# Patient Record
Sex: Female | Born: 1950 | Race: White | Hispanic: No | Marital: Married | Smoking: Former smoker
Health system: Southern US, Community
[De-identification: ages and names within clinical notes are randomized; demographics above are authoritative.]

## PROBLEM LIST (undated history)

## (undated) DIAGNOSIS — Z79890 Hormone replacement therapy (postmenopausal): Secondary | ICD-10-CM

## (undated) DIAGNOSIS — K635 Polyp of colon: Secondary | ICD-10-CM

## (undated) DIAGNOSIS — T7840XA Allergy, unspecified, initial encounter: Secondary | ICD-10-CM

## (undated) DIAGNOSIS — Z973 Presence of spectacles and contact lenses: Secondary | ICD-10-CM

## (undated) DIAGNOSIS — Z524 Kidney donor: Secondary | ICD-10-CM

## (undated) DIAGNOSIS — R42 Dizziness and giddiness: Secondary | ICD-10-CM

## (undated) HISTORY — DX: Dizziness and giddiness: R42

## (undated) HISTORY — DX: Hormone replacement therapy: Z79.890

## (undated) HISTORY — PX: TUBAL LIGATION: SHX77

## (undated) HISTORY — DX: Allergy, unspecified, initial encounter: T78.40XA

## (undated) HISTORY — DX: Kidney donor: Z52.4

## (undated) HISTORY — DX: Polyp of colon: K63.5

## (undated) HISTORY — PX: WISDOM TOOTH EXTRACTION: SHX21

## (undated) HISTORY — DX: Presence of spectacles and contact lenses: Z97.3

---
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
## 2001-01-05 HISTORY — PX: BUNIONECTOMY: SHX129

## 2003-01-06 HISTORY — PX: OTHER SURGICAL HISTORY: SHX169

## 2009-11-05 ENCOUNTER — Ambulatory Visit: Payer: Self-pay | Admitting: Family Medicine

## 2010-01-05 HISTORY — PX: COLONOSCOPY: SHX174

## 2010-07-15 ENCOUNTER — Encounter: Payer: Self-pay | Admitting: Medical

## 2010-07-15 ENCOUNTER — Ambulatory Visit (INDEPENDENT_AMBULATORY_CARE_PROVIDER_SITE_OTHER): Payer: BC Managed Care – PPO | Admitting: Medical

## 2010-07-15 ENCOUNTER — Encounter: Payer: Self-pay | Admitting: Family Medicine

## 2010-07-15 VITALS — BP 118/72 | HR 60 | Temp 98.1°F | Ht 65.5 in | Wt 146.0 lb

## 2010-07-15 DIAGNOSIS — S90859A Superficial foreign body, unspecified foot, initial encounter: Secondary | ICD-10-CM

## 2010-07-15 DIAGNOSIS — H68009 Unspecified Eustachian salpingitis, unspecified ear: Secondary | ICD-10-CM

## 2010-07-15 DIAGNOSIS — IMO0002 Reserved for concepts with insufficient information to code with codable children: Secondary | ICD-10-CM

## 2010-07-15 MED ORDER — AMOXICILLIN 875 MG PO TABS: 875.0000 mg | ORAL_TABLET | Freq: Two times a day (BID) | ORAL | Status: AC

## 2010-07-15 NOTE — Progress Notes (Signed)
Subjective:   HPI  Grace Gates is a 60 y.o. female who presents for 2.5 wk hx/o cold symptoms, cough, congestion, but over the last week work ear pressure, neck and throat discomfort.  Last day or so with eye itching and clear drainage.  Using Nyquil and Dayquil which helps, but when she stops this the symptoms worsen.  She also notes splinter in her right foot since being at the beach last week.  No other aggravating or relieving factors.  No other c/o.  The following portions of the patient's history were reviewed and updated as appropriate: allergies, current medications, past family history, past medical history, past social history, past surgical history and problem list.   Review of Systems Constitutional: denies fever, chills, sweats ENT: +runny nose, ear pain; denies hoarseness, sinus pain Cardiology: denies chest pain, palpitations, edema Respiratory: denies cough, shortness of breath, wheezing Gastroenterology: denies abdominal pain, nausea, vomiting, diarrhea    Objective:   Physical Exam  General appearance: alert, no distress, WD/WN HEENT: normocephalic, sclerae anicteric, PERRLA, EOMi,TMs with serous effusion, nares with swollen turbinates and clear discharge, mild erythema of pharynx Oral cavity: MMM, no lesions Neck: supple, no lymphadenopathy, no thyromegaly, no masses Heart: RRR, normal S1, S2, no murmurs Lungs: CTA bilaterally, no wheezes, rhonchi, or rales Skin: right foot volar surface of proximal heel with indurated slightly raised area with obvious foreign body  Assessment :    Encounter Diagnoses  Name Primary?  . Eustachian tube salpingitis Yes  . Foreign body in foot      Plan:   Eustachian tube salpingitis - discussed symptomatic therapy, Amoxicillin BID, rest, call if not improving.   Foreign body - discussed risks/benefits of procedure, she consent.  Prepped in normal sterile fashion, used  1 cc lidocaine 1% without epi for local anesthesia, used  scalpel and forceps to explore and remove small 1mm pointed foreign body.  Covered with sterile bandage and some antibiotic ointment.  Pt tolerated procedure well.  Discussed wound care.  Return if any concerns.

## 2010-08-14 ENCOUNTER — Telehealth: Payer: Self-pay | Admitting: Family Medicine

## 2010-08-14 NOTE — Telephone Encounter (Signed)
Needs an appointment.

## 2010-08-14 NOTE — Telephone Encounter (Signed)
Pt had ov 1 mo ago  Ear pain.  It has started back.  Does she need to be seen again or can she have round 2 of antibiotics to [facility]  On [address]  Please advise pt.

## 2010-08-14 NOTE — Telephone Encounter (Signed)
Pt has made appt to see shane

## 2010-08-15 ENCOUNTER — Ambulatory Visit (INDEPENDENT_AMBULATORY_CARE_PROVIDER_SITE_OTHER): Payer: BC Managed Care – PPO | Admitting: Medical

## 2010-08-15 ENCOUNTER — Encounter: Payer: Self-pay | Admitting: Medical

## 2010-08-15 DIAGNOSIS — H68009 Unspecified Eustachian salpingitis, unspecified ear: Secondary | ICD-10-CM

## 2010-08-15 MED ORDER — AMOXICILLIN 875 MG PO TABS: 875.0000 mg | ORAL_TABLET | Freq: Two times a day (BID) | ORAL | Status: AC

## 2010-08-15 MED ORDER — FLUTICASONE PROPIONATE 50 MCG/ACT NA SUSP: 1.0000 | Freq: Every day | NASAL | Status: DC

## 2010-08-15 NOTE — Progress Notes (Signed)
  Subjective:   HPI Grace Gates is a 60 y.o. female who presents for recheck.  I saw her a month ago for eustachian tube dysfunction.  She took the round of Amoxicillin and felt much improved within a few days.  She has felt fine up until this past Sunday 6 days ago.  She notes left side ear pain, and a feeling of warmth/wet in the ear.  She denies swimming, denies ear discharge.  Using some OTC Dayquil with relief.  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  No other aggravating or relieving factors.  No other c/o.  The following portions of the patient's history were reviewed and updated as appropriate: allergies, current medications, past family history, past medical history, past social history, past surgical history and problem list.  Review of Systems GEN: no fever, chills, sweats Allergy: no sneezing, eye discharge or itching Skin: no rash HEENT: no ST, headache, sinus pressure, runny nose, no teeth pain Lung: no cough, SOB Neuro: no vision or hearing changes GI: no n/v/d, or abdominal pain    Objective:   Physical Exam  General appearance: alert, no distress, WD/WN, white female Skin: warm, dry, no warmth or erythema of left face HEENT: conjunctiva normal, nares patent, no swelling, pharynx normal, left TM with slight effusion behind TM, right TM normal Neck: supple, non tender, no lymphadenopathy, no thyromegaly Lungs: CTA    Assessment :    Encounter Diagnosis  Name Primary?  . Eustachian salpingitis Yes     Plan:     Advised Zyrtec OTC. If not improving in the next several days, add Fluticasone nasal.  If worsening ear pain, fever, then begin Amoxicillin.  I suspect this is allergen induced vs actual infection.  We discussed possibility of new adult onset allergen causing thinks problem.

## 2011-03-10 ENCOUNTER — Encounter: Payer: Self-pay | Admitting: Medical

## 2011-03-10 ENCOUNTER — Ambulatory Visit (INDEPENDENT_AMBULATORY_CARE_PROVIDER_SITE_OTHER): Payer: BC Managed Care – PPO | Admitting: Medical

## 2011-03-10 ENCOUNTER — Ambulatory Visit
Admission: RE | Admit: 2011-03-10 | Discharge: 2011-03-10 | Disposition: A | Discharge status: Home / Self Care | Payer: BC Managed Care – PPO | Source: Ambulatory Visit | Attending: Medical | Admitting: Medical

## 2011-03-10 VITALS — BP 120/80 | HR 62 | Temp 98.0°F | Resp 16 | Ht 65.0 in | Wt 152.0 lb

## 2011-03-10 DIAGNOSIS — Z0000 Encounter for general adult medical examination without abnormal findings: Secondary | ICD-10-CM

## 2011-03-10 DIAGNOSIS — M6740 Ganglion, unspecified site: Secondary | ICD-10-CM

## 2011-03-10 DIAGNOSIS — Z524 Kidney donor: Secondary | ICD-10-CM | POA: Insufficient documentation

## 2011-03-10 DIAGNOSIS — Z87891 Personal history of nicotine dependence: Secondary | ICD-10-CM

## 2011-03-10 DIAGNOSIS — R0789 Other chest pain: Secondary | ICD-10-CM

## 2011-03-10 DIAGNOSIS — R071 Chest pain on breathing: Secondary | ICD-10-CM

## 2011-03-10 LAB — LIPID PANEL
HDL: 63 mg/dL (ref >39)
LDL Cholesterol: 109 mg/dL — ABNORMAL HIGH (ref 0–99)
Total CHOL/HDL Ratio: 3 Ratio
Triglycerides: 87 mg/dL (ref <150)

## 2011-03-10 LAB — POCT URINALYSIS DIPSTICK
Bilirubin, UA: NEGATIVE
Ketones, UA: NEGATIVE
Leukocytes, UA: NEGATIVE
Protein, UA: NEGATIVE

## 2011-03-10 LAB — COMPREHENSIVE METABOLIC PANEL
ALT: 13 U/L (ref 0–35)
BUN: 16 mg/dL (ref 6–23)
CO2: 25 mEq/L (ref 19–32)
Calcium: 9.9 mg/dL (ref 8.4–10.5)
Creat: 1.03 mg/dL (ref 0.50–1.10)
Total Bilirubin: 0.5 mg/dL (ref 0.3–1.2)

## 2011-03-10 LAB — CBC WITH DIFFERENTIAL/PLATELET
Eosinophils Relative: 5 % (ref 0–5)
HCT: 41.7 % (ref 36.0–46.0)
Hemoglobin: 13.2 g/dL (ref 12.0–15.0)
Lymphocytes Relative: 26 % (ref 12–46)
Lymphs Abs: 1.1 10*3/uL (ref 0.7–4.0)
MCV: 91.2 fL (ref 78.0–100.0)
Monocytes Absolute: 0.6 10*3/uL (ref 0.1–1.0)
Monocytes Relative: 14 % — ABNORMAL HIGH (ref 3–12)
Neutro Abs: 2.4 10*3/uL (ref 1.7–7.7)
RBC: 4.57 MIL/uL (ref 3.87–5.11)
RDW: 14.2 % (ref 11.5–15.5)
WBC: 4.3 10*3/uL (ref 4.0–10.5)

## 2011-03-10 NOTE — Progress Notes (Signed)
Subjective:   HPI  Grace Gates is a 61 y.o. female who presents for a complete physical.  Sees gynecology routinely.  Last Tdap 2010, gets flu shot yearly, last dental care 10/12, last eye care 10/12, EKG with last physical, Dr. [facility].  Has had spirometry in the past in [place], hx/o former smoker long term.  Only c/o today is dullness across middle upper pack, mild intermittent pain.  Wants CXR.  She notes right wrist ganglion, no recent changes or enlargement.    Reviewed their medical, surgical, family, social, medication, and allergy history and updated chart as appropriate.  Past Medical History  Diagnosis Date  . Wears glasses     wears bifocals  . Allergy     seasonal  . Kidney donor   . Colon polyps   . Postmenopausal HRT (hormone replacement therapy)     [facility]  . H/O screening mammography     last 10/2010 along with pap smear    Past Surgical History  Procedure Date  . Tubal ligation   . Kidney transplant donor 2005  . Wisdom tooth extraction   . Appendectomy 1973  . Cholecystectomy 1973  . Colonoscopy 01/2010    [facility]    Family History  Problem Relation Age of Onset  . Alcohol abuse Mother   . Cirrhosis Mother     died of cirrhosis  . Cancer Father     died of rare bone cancer  . Heart disease Paternal Uncle     died of MI  . Stroke Neg Hx   . Hypertension Neg Hx   . Diabetes Neg Hx     History   Social History  . Marital Status: Married    Spouse Name: N/A    Number of Children: N/A  . Years of Education: N/A   Occupational History  . [employer]   Social History Main Topics  . Smoking status: Former Smoker -- 1.0 packs/day for 40 years  . Smokeless tobacco: Never Used  . Alcohol Use: 2.0 oz/week    4 drink(s) per week  . Drug Use: No  . Sexually Active: Not on file   Other Topics Concern  . Not on file   Social History  Narrative   Married, 2 boys, exercise 5 days/wk, walking    Current Outpatient Prescriptions on File Prior to Visit  Medication Sig Dispense Refill  . Estradiol 0.52 MG/0.87 GM (0.06%) GEL Place 1 Squirt onto the skin daily.        . fluticasone (FLONASE) 50 MCG/ACT nasal spray Place 1 spray into the nose daily.  16 g  2  . progesterone (PROMETRIUM) 100 MG capsule Take 100 mg by mouth daily.          Allergies  Allergen Reactions  . Latex     rash   Review of Systems Constitutional: -fever, -chills, -sweats, -unexpected weight change, -anorexia, -fatigue Allergy: -sneezing, -itching, -congestion Dermatology: denies changing moles, rash, lumps, new worrisome lesions ENT: -runny nose, -ear pain, -sore throat, -hoarseness, -sinus pain, -teeth pain, -tinnitus, -hearing loss, -epistaxis Cardiology:  -chest pain, -palpitations, -edema, -orthopnea, -paroxysmal nocturnal dyspnea Respiratory: -cough, -shortness of breath, -dyspnea on exertion, -wheezing, -hemoptysis Gastroenterology: -abdominal pain, -nausea, -vomiting, -diarrhea, -constipation, -blood in stool, -changes in bowel movement, -dysphagia Hematology: -bleeding or bruising problems Musculoskeletal: -arthralgias, -myalgias, -joint swelling, +back pain, -neck pain, -cramping, -gait changes Ophthalmology: -vision changes, -eye redness, -itching, -discharge Urology: -  dysuria, -difficulty urinating, -hematuria, -urinary frequency, -urgency, incontinence Neurology: -headache, -weakness, -tingling, -numbness, -speech abnormality, -memory loss, -falls, -dizziness Psychology:  -depressed mood, -agitation, -sleep problems    Objective:   Physical Exam  Filed Vitals:   03/10/11 0841  BP: 120/80  Pulse: 62  Temp: 98 F (36.7 C)    General appearance: alert, no distress, WD/WN, lean white female Skin: left lower jaw with 3mm raised round, somewhat uniform flesh colored lesion.  Similar smaller 2.5 mm lesion of right face, slightly  furrowed, burt uniform flesh colored, otherwise skin unremarkable HEENT: normocephalic, conjunctiva/corneas normal, sclerae anicteric, PERRLA, EOMi, nares patent, no discharge or erythema, pharynx normal Oral cavity: MMM, tongue normal, teeth in good repair Neck: supple, no lymphadenopathy, no thyromegaly, no masses, normal ROM, no bruits Chest: non tender, normal shape and expansion Heart: RRR, normal S1, S2, no murmurs Lungs: CTA bilaterally, no wheezes, rhonchi, or rales Abdomen: +bs, right vertical and central vertical surgical scars, otherwise soft, non tender, non distended, no masses, no hepatomegaly, no splenomegaly, no bruits Back: non tender, normal ROM, no scoliosis Musculoskeletal: right dorsal hand along 4th metacarpal with roundish 1cm nodule that moves along tendon sheath, c/w ganglion cyst, upper extremities non tender, no obvious deformity, normal ROM throughout, lower extremities non tender, no obvious deformity, normal ROM throughout Extremities: no edema, no cyanosis, no clubbing Pulses: 2+ symmetric, upper and lower extremities, normal cap refill Neurological: alert, oriented x 3, CN2-12 intact, strength normal upper extremities and lower extremities, sensation normal throughout, DTRs 2+ throughout, no cerebellar signs, gait normal Psychiatric: normal affect, behavior normal, pleasant  Breast/gyn/rectal - deferred to gynecology   Assessment and Plan :    Encounter Diagnoses  Name Primary?  . Routine general medical examination at a health care facility Yes  . Chest wall pain   . Former smoker   . Ganglion cyst   . Kidney donor    Physical exam - discussed healthy lifestyle, diet, exercise, preventative care, vaccinations, and addressed their concerns.  Handout given.  It would be helpful to get copies of colonoscopy, prior EKG, prior spirometry.  Chest wall pain, posterior - CXR today.  Likely muscle spasm, but given smoking hx/o, will pursue CXR.  Ganglion cyst -  discussed findings.  When this worsens over time, she will let me know when she wants referral to ortho.   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  Follow-up pending labs.

## 2011-03-10 NOTE — Patient Instructions (Signed)

## 2011-07-16 ENCOUNTER — Encounter: Payer: Self-pay | Admitting: Family Medicine

## 2011-07-16 ENCOUNTER — Ambulatory Visit (INDEPENDENT_AMBULATORY_CARE_PROVIDER_SITE_OTHER): Payer: BC Managed Care – PPO | Admitting: Family Medicine

## 2011-07-16 VITALS — BP 112/70 | HR 74 | Wt 148.0 lb

## 2011-07-16 DIAGNOSIS — M6740 Ganglion, unspecified site: Secondary | ICD-10-CM

## 2011-07-16 DIAGNOSIS — M25542 Pain in joints of left hand: Secondary | ICD-10-CM

## 2011-07-16 DIAGNOSIS — M25549 Pain in joints of unspecified hand: Secondary | ICD-10-CM

## 2011-07-16 DIAGNOSIS — M67441 Ganglion, right hand: Secondary | ICD-10-CM

## 2011-07-16 NOTE — Progress Notes (Signed)
  Subjective:    Patient ID: Grace Gates, female    DOB: [birth year], 61 y.o.   MRN: [id number]  HPI She complains of pain and interference with function of the fourth finger at the PIP joint. She has no history of injury to this. She also has a ganglion present on the right hand that is apparently getting slightly larger.   Review of Systems     Objective:   Physical Exam 1-1/2 cm movable fluctuant lesion noted on the dorsal surface of the right hand that moves with motion of the finger. Exam of the left hand shows slight tenderness palpation at the joint but no effusion noted. No other joints seem to be involved.       Assessment & Plan:   1. Ganglion of right hand  Ambulatory referral to Orthopedic Surgery  2. Pain in joint of left hand  Ambulatory referral to Orthopedic Surgery   the etiology of her left hand pain is unclear. Orthopedic referral was made

## 2011-09-15 ENCOUNTER — Other Ambulatory Visit: Payer: Self-pay | Admitting: Orthopedic Surgery

## 2011-09-21 ENCOUNTER — Encounter (HOSPITAL_BASED_OUTPATIENT_CLINIC_OR_DEPARTMENT_OTHER)
Admission: RE | Admit: 2011-09-21 | Discharge: 2011-09-21 | Disposition: A | Discharge status: Home / Self Care | Payer: BC Managed Care – PPO | Source: Ambulatory Visit | Attending: Orthopedic Surgery | Admitting: Orthopedic Surgery

## 2011-09-22 ENCOUNTER — Encounter (HOSPITAL_BASED_OUTPATIENT_CLINIC_OR_DEPARTMENT_OTHER): Payer: Self-pay | Admitting: *Deleted

## 2011-09-22 NOTE — Progress Notes (Signed)
No labs needed

## 2011-09-25 ENCOUNTER — Encounter (HOSPITAL_BASED_OUTPATIENT_CLINIC_OR_DEPARTMENT_OTHER): Payer: Self-pay | Admitting: Anesthesiology

## 2011-09-25 ENCOUNTER — Encounter (HOSPITAL_BASED_OUTPATIENT_CLINIC_OR_DEPARTMENT_OTHER)
Admission: RE | Disposition: A | Discharge status: Home / Self Care | Payer: Self-pay | Source: Ambulatory Visit | Attending: Orthopedic Surgery

## 2011-09-25 ENCOUNTER — Ambulatory Visit (HOSPITAL_BASED_OUTPATIENT_CLINIC_OR_DEPARTMENT_OTHER)
Admission: RE | Admit: 2011-09-25 | Discharge: 2011-09-25 | Disposition: A | Discharge status: Home / Self Care | Payer: BC Managed Care – PPO | Source: Ambulatory Visit | Attending: Orthopedic Surgery | Admitting: Orthopedic Surgery

## 2011-09-25 ENCOUNTER — Encounter (HOSPITAL_BASED_OUTPATIENT_CLINIC_OR_DEPARTMENT_OTHER): Payer: Self-pay | Admitting: Orthopedic Surgery

## 2011-09-25 ENCOUNTER — Ambulatory Visit (HOSPITAL_BASED_OUTPATIENT_CLINIC_OR_DEPARTMENT_OTHER): Payer: BC Managed Care – PPO | Admitting: Anesthesiology

## 2011-09-25 DIAGNOSIS — M6740 Ganglion, unspecified site: Secondary | ICD-10-CM | POA: Insufficient documentation

## 2011-09-25 HISTORY — PX: MASS EXCISION: SHX2000

## 2011-09-25 LAB — POCT HEMOGLOBIN-HEMACUE: Hemoglobin: 14.4 g/dL (ref 12.0–15.0)

## 2011-09-25 SURGERY — EXCISION MASS
Anesthesia: General | Site: Wrist | Laterality: Left | Wound class: Clean

## 2011-09-25 MED ORDER — PROMETHAZINE HCL 25 MG/ML IJ SOLN: 6.2500 mg | INTRAMUSCULAR | Status: DC | PRN

## 2011-09-25 MED ORDER — FENTANYL CITRATE 0.05 MG/ML IJ SOLN
INTRAMUSCULAR | Status: DC | PRN
  Administered 2011-09-25 (×2): 50 ug via INTRAVENOUS

## 2011-09-25 MED ORDER — OXYCODONE HCL 5 MG/5ML PO SOLN: 5.0000 mg | Freq: Once | ORAL | Status: DC | PRN

## 2011-09-25 MED ORDER — CHLORHEXIDINE GLUCONATE 4 % EX LIQD: 60.0000 mL | Freq: Once | CUTANEOUS | Status: DC

## 2011-09-25 MED ORDER — BUPIVACAINE HCL (PF) 0.25 % IJ SOLN
INTRAMUSCULAR | Status: DC | PRN
  Administered 2011-09-25: 2.5 mL

## 2011-09-25 MED ORDER — CEFAZOLIN SODIUM-DEXTROSE 2-3 GM-% IV SOLR
2.0000 g | INTRAVENOUS | Status: AC
  Administered 2011-09-25: 2 g via INTRAVENOUS

## 2011-09-25 MED ORDER — HYDROCODONE-ACETAMINOPHEN 5-500 MG PO TABS: 1.0000 | ORAL_TABLET | ORAL | Status: AC | PRN

## 2011-09-25 MED ORDER — LACTATED RINGERS IV SOLN
INTRAVENOUS | Status: DC
  Administered 2011-09-25 (×2): via INTRAVENOUS

## 2011-09-25 MED ORDER — ONDANSETRON HCL 4 MG/2ML IJ SOLN
INTRAMUSCULAR | Status: DC | PRN
  Administered 2011-09-25: 4 mg via INTRAVENOUS

## 2011-09-25 MED ORDER — DEXAMETHASONE SODIUM PHOSPHATE 10 MG/ML IJ SOLN
INTRAMUSCULAR | Status: DC | PRN
  Administered 2011-09-25: 10 mg via INTRAVENOUS

## 2011-09-25 MED ORDER — HYDROMORPHONE HCL PF 1 MG/ML IJ SOLN: 0.2500 mg | INTRAMUSCULAR | Status: DC | PRN

## 2011-09-25 MED ORDER — OXYCODONE HCL 5 MG PO TABS: 5.0000 mg | ORAL_TABLET | Freq: Once | ORAL | Status: DC | PRN

## 2011-09-25 MED ORDER — LIDOCAINE HCL (CARDIAC) 20 MG/ML IV SOLN
INTRAVENOUS | Status: DC | PRN
  Administered 2011-09-25: 50 mg via INTRAVENOUS

## 2011-09-25 MED ORDER — MEPERIDINE HCL 25 MG/ML IJ SOLN: 6.2500 mg | INTRAMUSCULAR | Status: DC | PRN

## 2011-09-25 SURGICAL SUPPLY — 50 items
BANDAGE COBAN STERILE 2 (GAUZE/BANDAGES/DRESSINGS) IMPLANT
BANDAGE GAUZE ELAST BULKY 4 IN (GAUZE/BANDAGES/DRESSINGS) ×2 IMPLANT
BLADE MINI RND TIP GREEN BEAV (BLADE) IMPLANT
BLADE SURG 15 STRL LF DISP TIS (BLADE) ×1 IMPLANT
BLADE SURG 15 STRL SS (BLADE) ×1
BNDG COHESIVE 1X5 TAN STRL LF (GAUZE/BANDAGES/DRESSINGS) IMPLANT
BNDG COHESIVE 3X5 TAN STRL LF (GAUZE/BANDAGES/DRESSINGS) IMPLANT
BNDG ESMARK 4X9 LF (GAUZE/BANDAGES/DRESSINGS) IMPLANT
CHLORAPREP W/TINT 26ML (MISCELLANEOUS) ×2 IMPLANT
CLOTH BEACON ORANGE TIMEOUT ST (SAFETY) ×2 IMPLANT
CORDS BIPOLAR (ELECTRODE) ×2 IMPLANT
COVER MAYO STAND STRL (DRAPES) ×2 IMPLANT
COVER TABLE BACK 60X90 (DRAPES) ×2 IMPLANT
CUFF TOURNIQUET SINGLE 18IN (TOURNIQUET CUFF) ×2 IMPLANT
DECANTER SPIKE VIAL GLASS SM (MISCELLANEOUS) IMPLANT
DRAIN PENROSE 1/2X12 LTX STRL (WOUND CARE) IMPLANT
DRAPE EXTREMITY T 121X128X90 (DRAPE) ×2 IMPLANT
DRAPE SURG 17X23 STRL (DRAPES) ×2 IMPLANT
GAUZE XEROFORM 1X8 LF (GAUZE/BANDAGES/DRESSINGS) ×2 IMPLANT
GLOVE BIO SURGEON STRL SZ 6.5 (GLOVE) IMPLANT
GLOVE SKINSENSE NS SZ6.5 (GLOVE) ×1
GLOVE SKINSENSE NS SZ8.0 LF (GLOVE) ×1
GLOVE SKINSENSE STRL SZ6.5 (GLOVE) ×1 IMPLANT
GLOVE SKINSENSE STRL SZ8.0 LF (GLOVE) ×1 IMPLANT
GLOVE SURG ORTHO 8.0 STRL STRW (GLOVE) IMPLANT
GOWN BRE IMP PREV XXLGXLNG (GOWN DISPOSABLE) ×2 IMPLANT
GOWN PREVENTION PLUS XLARGE (GOWN DISPOSABLE) ×2 IMPLANT
NDL SAFETY ECLIPSE 18X1.5 (NEEDLE) ×1 IMPLANT
NEEDLE 27GAX1X1/2 (NEEDLE) ×2 IMPLANT
NEEDLE HYPO 18GX1.5 SHARP (NEEDLE) ×1
NS IRRIG 1000ML POUR BTL (IV SOLUTION) ×2 IMPLANT
PACK BASIN DAY SURGERY FS (CUSTOM PROCEDURE TRAY) ×2 IMPLANT
PAD CAST 3X4 CTTN HI CHSV (CAST SUPPLIES) IMPLANT
PADDING CAST ABS 3INX4YD NS (CAST SUPPLIES)
PADDING CAST ABS 4INX4YD NS (CAST SUPPLIES)
PADDING CAST ABS COTTON 3X4 (CAST SUPPLIES) IMPLANT
PADDING CAST ABS COTTON 4X4 ST (CAST SUPPLIES) IMPLANT
PADDING CAST COTTON 3X4 STRL (CAST SUPPLIES)
SPLINT PLASTER CAST XFAST 3X15 (CAST SUPPLIES) IMPLANT
SPLINT PLASTER XTRA FASTSET 3X (CAST SUPPLIES)
SPONGE GAUZE 4X4 12PLY (GAUZE/BANDAGES/DRESSINGS) ×2 IMPLANT
STOCKINETTE 4X48 STRL (DRAPES) ×2 IMPLANT
SUT VIC AB 4-0 P2 18 (SUTURE) IMPLANT
SUT VICRYL RAPID 5 0 P 3 (SUTURE) IMPLANT
SUT VICRYL RAPIDE 4/0 PS 2 (SUTURE) ×2 IMPLANT
SYR BULB 3OZ (MISCELLANEOUS) ×2 IMPLANT
SYR CONTROL 10ML LL (SYRINGE) ×2 IMPLANT
TOWEL OR 17X24 6PK STRL BLUE (TOWEL DISPOSABLE) ×2 IMPLANT
UNDERPAD 30X30 INCONTINENT (UNDERPADS AND DIAPERS) ×2 IMPLANT
WATER STERILE IRR 1000ML POUR (IV SOLUTION) ×2 IMPLANT

## 2011-09-25 NOTE — Anesthesia Procedure Notes (Signed)
Procedure Name: LMA Insertion Date/Time: 09/25/2011 12:43 PM Performed by: Burna Cash Pre-anesthesia Checklist: Patient identified, Emergency Drugs available, Suction available and Patient being monitored Patient Re-evaluated:Patient Re-evaluated prior to inductionOxygen Delivery Method: Circle System Utilized Preoxygenation: Pre-oxygenation with 100% oxygen Intubation Type: IV induction Ventilation: Mask ventilation without difficulty LMA: LMA inserted LMA Size: 4.0 Number of attempts: 1 Airway Equipment and Method: bite block Placement Confirmation: positive ETCO2 Tube secured with: Tape Dental Injury: Teeth and Oropharynx as per pre-operative assessment

## 2011-09-25 NOTE — Op Note (Signed)
Dictated number: [id number]

## 2011-09-25 NOTE — Brief Op Note (Signed)
09/25/2011  1:17 PM  PATIENT:  Grace Gates  61 y.o. female  PRE-OPERATIVE DIAGNOSIS:  cyst right ring extensor tendon  POST-OPERATIVE DIAGNOSIS:  cyst right ring extensor tendon  PROCEDURE:  Procedure(s) (LRB) with comments: EXCISION MASS (Left) - excision cyst right ring finger and possible repair of extensor tendon wrist  SURGEON:  Surgeon(s) and Role:    * Nicki Reaper, MD - Primary  PHYSICIAN ASSISTANT:   ASSISTANTS: none   ANESTHESIA:   local and general  EBL:  Total I/O In: 1000 [I.V.:1000] Out: -   BLOOD ADMINISTERED:none  DRAINS: none   LOCAL MEDICATIONS USED:  MARCAINE     SPECIMEN:  Excision  DISPOSITION OF SPECIMEN:  PATHOLOGY  COUNTS:  YES  TOURNIQUET:   Total Tourniquet Time Documented: Upper Arm (Left) - 16 minutes  DICTATION: .Other Dictation: Dictation Number [id number]  PLAN OF CARE: Discharge to home after PACU  PATIENT DISPOSITION:  PACU - hemodynamically stable.

## 2011-09-25 NOTE — Transfer of Care (Signed)
Immediate Anesthesia Transfer of Care Note  Patient: Grace Gates  Procedure(s) Performed: Procedure(s) (LRB) with comments: EXCISION MASS (Left) - excision cyst right ring finger and possible repair of extensor tendon wrist  Patient Location: PACU  Anesthesia Type: General  Level of Consciousness: sedated  Airway & Oxygen Therapy: Patient Spontanous Breathing and Patient connected to face mask oxygen  Post-op Assessment: Report given to PACU RN and Post -op Vital signs reviewed and stable  Post vital signs: Reviewed and stable  Complications: No apparent anesthesia complications

## 2011-09-25 NOTE — Anesthesia Postprocedure Evaluation (Signed)
  Anesthesia Post-op Note  Patient: Grace Gates  Procedure(s) Performed: Procedure(s) (LRB) with comments: EXCISION MASS (Left) - excision cyst right ring finger and possible repair of extensor tendon wrist  Patient Location: PACU  Anesthesia Type: General  Level of Consciousness: awake  Airway and Oxygen Therapy: Patient Spontanous Breathing  Post-op Pain: mild  Post-op Assessment: Post-op Vital signs reviewed  Post-op Vital Signs: stable  Complications: No apparent anesthesia complications

## 2011-09-25 NOTE — Anesthesia Preprocedure Evaluation (Signed)
Anesthesia Evaluation  Patient identified by MRN, date of birth, ID band Patient awake    Reviewed: Allergy & Precautions, H&P , NPO status , Patient's Chart, lab work & pertinent test results  History of Anesthesia Complications Negative for: history of anesthetic complications  Airway Mallampati: I  Neck ROM: full    Dental No notable dental hx.    Pulmonary neg pulmonary ROS,  breath sounds clear to auscultation  Pulmonary exam normal       Cardiovascular negative cardio ROS  IRhythm:regular Rate:Normal     Neuro/Psych negative neurological ROS  negative psych ROS   GI/Hepatic negative GI ROS, Neg liver ROS,   Endo/Other  negative endocrine ROS  Renal/GU negative Renal ROS  negative genitourinary   Musculoskeletal   Abdominal   Peds  Hematology negative hematology ROS (+)   Anesthesia Other Findings   Reproductive/Obstetrics negative OB ROS                           Anesthesia Physical Anesthesia Plan  ASA: I  Anesthesia Plan: General and General LMA   Post-op Pain Management:    Induction:   Airway Management Planned:   Additional Equipment:   Intra-op Plan:   Post-operative Plan:   Informed Consent: I have reviewed the patients History and Physical, chart, labs and discussed the procedure including the risks, benefits and alternatives for the proposed anesthesia with the patient or authorized representative who has indicated his/her understanding and acceptance.     Plan Discussed with: CRNA and Surgeon  Anesthesia Plan Comments:         Anesthesia Quick Evaluation

## 2011-09-25 NOTE — H&P (Signed)
Grace Gates is a 61 year-old right-hand dominant female referred by Dr. Susann Givens for consultation with respect to pain in her left ring finger, mass on the dorsal aspect of her right hand.  This has been present on her right hand for approximately four months. She has had pain in her left ring finger for approximately five weeks, complains of this over the metacarpophalangeal joint volar aspect. She has no history of injury.  She has no history of diabetes, thyroid problems, arthritis or gout. There is no history of injury to her neck. She has no night symptoms. She complains of intermittent, moderate to severe dull sharp pain, she states it has gotten somewhat better.  Activity makes it worse.  She has not had any treatment nor has she tried anything for this.   ALLERGIES:     Latex.  MEDICATIONS:    Elestrin and progesterone.  SURGICAL HISTORY:    Tubal ligation in '82, cholecystectomy in '73, hammertoe in 2002, kidney donation in 2005.  FAMILY MEDICAL HISTORY:    Negative.  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  REVIEW OF SYSTEMS:   Positive for glasses, otherwise negative 14 points. Grace Gates is an 61 y.o. female.   Chief Complaint: Mass RRF tendon HPI: see above  Past Medical History  Diagnosis Date  . Wears glasses     wears bifocals  . Allergy     seasonal  . Kidney donor   . Colon polyps   . Postmenopausal HRT (hormone replacement therapy)     [facility]  . H/O screening mammography     last 10/2010 along with pap smear    Past Surgical History  Procedure Date  . Tubal ligation   . Kidney transplant donor 2005    gave one to husband-the right one  . Wisdom tooth extraction   . Appendectomy 1973  . Cholecystectomy 1973  . Colonoscopy 01/2010    [facility]    Family History  Problem Relation Age of Onset  . Alcohol abuse Mother   .  Cirrhosis Mother     died of cirrhosis  . Cancer Father     died of rare bone cancer  . Heart disease Paternal Uncle     died of MI  . Stroke Neg Hx   . Hypertension Neg Hx   . Diabetes Neg Hx    Social History:  reports that she quit smoking about 3 years ago. She has never used smokeless tobacco. She reports that she drinks about 2 ounces of alcohol per week. She reports that she does not use illicit drugs.  Allergies:  Allergies  Allergen Reactions  . Latex     rash    No prescriptions prior to admission    No results found for this or any previous visit (from the past 48 hour(s)).  No results found.   Pertinent items are noted in HPI.  Height 5\' 5"  (1.651 m), weight 67.132 kg (148 lb).  General appearance: alert, cooperative and appears stated age Head: Normocephalic, without obvious abnormality Neck: no adenopathy Resp: clear to auscultation bilaterally Cardio: regular rate and rhythm, S1, S2 normal, no murmur, click, rub or gallop GI: soft, non-tender; bowel sounds normal; no masses,  no organomegaly Extremities: extremities normal, atraumatic, no cyanosis or edema Pulses: 2+ and symmetric Skin: Skin color, texture, turgor normal. No rashes  or lesions Neurologic: Grossly normal Incision/Wound: na  Assessment/Plan DX: mass RRF extensor tendon possible wrist ganglion  She would like to have the mass excised from the dorsal aspect of her right hand. The pre, peri and post op course are discussed along with risks and complications. She is aware there is no guarantee with surgery, possibility of infection, recurrence, injury to arteries, nerves and tendons, incomplete relief of symptoms and dystrophy.  She is scheduled for excision mass, possible repair extensor tendon right hand ring finger.  Shakema Surita R 09/25/2011, 10:08 AM

## 2011-09-28 NOTE — Op Note (Signed)
[name], [name]:  [id number]  MEDICAL RECORD NO.:  [id number]  LOCATION:                                 FACILITY:  PHYSICIAN:  Cindee Salt, M.D.            DATE OF BIRTH:  DATE OF PROCEDURE:  09/25/2011 DATE OF DISCHARGE:                              OPERATIVE REPORT   PREOPERATIVE DIAGNOSIS:  Mass, right ring finger.  POSTOPERATIVE DIAGNOSIS:  Mass, right ring finger.  OPERATION:  Excision of mass, right ring finger, probable ganglion cyst.  SURGEON:  Cindee Salt, MD  ANESTHESIA:  General with local infiltration.  ANESTHESIOLOGIST:  Burna Forts, MD.  HISTORY:  The patient is a 61 year old female with a history of a mass over the ring finger of her right hand.  She is desirous having this excised.  Pre, peri, postoperative courses have been discussed along with risks and complications.  She is aware there is no guarantee with surgery; possibility of infection; recurrence; injury to arteries, nerves, tendons; incomplete relief of symptoms; dystrophy.  In the preoperative area, the patient is seen.  The extremity marked by both the patient and surgeon.  Antibiotic given.  PROCEDURE:  The patient was brought to the operating room, where a general anesthetic was carried out without difficulty.  She was prepped using ChloraPrep in supine position with right arm free.  A 3-minute dry time was allowed.  Time-out taken confirming the patient and procedure. A longitudinal incision was made over the mass carried down through the subcutaneous tissue.  Bleeders were electrocauterized with bipolar.  The mass was immediately encountered over the extensor tendon.  This was followed proximally to the extensor retinaculum, where it appeared to originate.  Did not appear to originate from the wrist.  This was excised in toto, sent to pathology.  The tendon was intact.  The wound was copiously irrigated with saline.  Subcutaneous tissue closed  with interrupted 4-0 Vicryl Rapide, and the skin with a subcuticular 4-0 Vicryl Rapide suture.  A local infiltration of 0.25% Marcaine without epinephrine was given, 2.5 mL was used.  Sterile compressive dressing applied with the fingers free.  On deflation of the tourniquet, all fingers immediately pinked.  She was taken to the recovery room for observation in satisfactory condition.  She will be discharged home.  She will return to the [facility] [place] in 1 week, on Vicodin.          ______________________________ Cindee Salt, M.D.     GK/MEDQ  D:  09/25/2011  T:  09/26/2011  Job:  [id number]

## 2011-09-29 ENCOUNTER — Encounter (HOSPITAL_BASED_OUTPATIENT_CLINIC_OR_DEPARTMENT_OTHER): Payer: Self-pay | Admitting: Orthopedic Surgery

## 2011-09-30 ENCOUNTER — Encounter (HOSPITAL_BASED_OUTPATIENT_CLINIC_OR_DEPARTMENT_OTHER): Payer: Self-pay

## 2011-12-28 ENCOUNTER — Other Ambulatory Visit: Payer: Self-pay | Admitting: Gastroenterology

## 2012-01-06 HISTORY — PX: HAND SURGERY: SHX662

## 2012-02-10 ENCOUNTER — Encounter: Payer: Self-pay | Admitting: Medical

## 2013-01-05 DIAGNOSIS — R42 Dizziness and giddiness: Secondary | ICD-10-CM

## 2013-01-05 HISTORY — DX: Dizziness and giddiness: R42

## 2013-09-08 ENCOUNTER — Encounter: Payer: Self-pay | Admitting: Medical

## 2013-09-08 ENCOUNTER — Ambulatory Visit (INDEPENDENT_AMBULATORY_CARE_PROVIDER_SITE_OTHER): Payer: BC Managed Care – PPO | Admitting: Medical

## 2013-09-08 VITALS — BP 110/80 | HR 72 | Temp 97.6°F | Resp 14 | Wt 152.0 lb

## 2013-09-08 DIAGNOSIS — R209 Unspecified disturbances of skin sensation: Secondary | ICD-10-CM

## 2013-09-08 DIAGNOSIS — R5383 Other fatigue: Secondary | ICD-10-CM

## 2013-09-08 DIAGNOSIS — R531 Weakness: Secondary | ICD-10-CM

## 2013-09-08 DIAGNOSIS — R42 Dizziness and giddiness: Secondary | ICD-10-CM

## 2013-09-08 DIAGNOSIS — R202 Paresthesia of skin: Secondary | ICD-10-CM

## 2013-09-08 DIAGNOSIS — R5381 Other malaise: Secondary | ICD-10-CM

## 2013-09-08 LAB — CBC WITH DIFFERENTIAL/PLATELET
BASOS ABS: 0 10*3/uL (ref 0.0–0.1)
BASOS PCT: 1 % (ref 0–1)
EOS PCT: 2 % (ref 0–5)
Eosinophils Absolute: 0.1 10*3/uL (ref 0.0–0.7)
HEMATOCRIT: 42.1 % (ref 36.0–46.0)
Hemoglobin: 13.8 g/dL (ref 12.0–15.0)
LYMPHS PCT: 26 % (ref 12–46)
Lymphs Abs: 1 10*3/uL (ref 0.7–4.0)
MCH: 28.3 pg (ref 26.0–34.0)
MCHC: 32.8 g/dL (ref 30.0–36.0)
MCV: 86.3 fL (ref 78.0–100.0)
MONO ABS: 0.7 10*3/uL (ref 0.1–1.0)
Monocytes Relative: 17 % — ABNORMAL HIGH (ref 3–12)
NEUTROS ABS: 2.2 10*3/uL (ref 1.7–7.7)
Neutrophils Relative %: 54 % (ref 43–77)
PLATELETS: 182 10*3/uL (ref 150–400)
RBC: 4.88 MIL/uL (ref 3.87–5.11)
RDW: 15.4 % (ref 11.5–15.5)
WBC: 4 10*3/uL (ref 4.0–10.5)

## 2013-09-08 LAB — BASIC METABOLIC PANEL WITH GFR
BUN: 17 mg/dL (ref 6–23)
CO2: 29 meq/L (ref 19–32)
Calcium: 9.6 mg/dL (ref 8.4–10.5)
Chloride: 101 meq/L (ref 96–112)
Creat: 1.02 mg/dL (ref 0.50–1.10)
Glucose, Bld: 148 mg/dL — ABNORMAL HIGH (ref 70–99)
Potassium: 4.3 meq/L (ref 3.5–5.3)
Sodium: 137 meq/L (ref 135–145)

## 2013-09-08 LAB — PROTIME-INR
INR: 0.96 (ref <1.50)
Prothrombin Time: 12.8 seconds (ref 11.6–15.2)

## 2013-09-08 LAB — TSH: TSH: 0.864 u[IU]/mL (ref 0.350–4.500)

## 2013-09-08 LAB — APTT: aPTT: 28 seconds (ref 24–37)

## 2013-09-08 NOTE — Progress Notes (Signed)
Subjective: Here today for concerns about symptoms she had yesterday.  She notes right before lunch yesterday went to pick up phone with left hand yesterday, and in an instance felt light headed, felt left arm go numb and tingly,arm felt very heavy.   Felt 50lb.  Completed the phone call, but sat there for a while.  Almost immediately felt the symptoms start to go away, and within 5 minutes seemed to be ok except left bicep hurt like she had been doing exercises.   Left thumb base hurt after the fact.  Felt generally weak like she could lie down.  Within 2 hours was fine.   No prior similar.  Since then no similar.  She had eaten at about 10:30am that morning an hour or 2 before the symptoms came on.  She denies having been dehydrated, denies ortho stasis getting up too fast, she was simply at rest when this happened. She denies any recent strenuous activity, any recent gradually worsening neck or arm pains.  She does note a history of occasional neck pain, history of left carpal tunnel syndrome. However this symptom yesterday affected her entire left arm and she felt weak and fatigued afterwards. She denies any seizure, convulsions, no slurred speech, no confusion.  No headache.   Denied chest pain or shortness of breath.  Ever since then she has been fine.   Currently she feels in her normal state of health, she wanted to come in and get this checked out  Review of Systems Constitutional: -fever, -chills, -sweats, -unexpected weight change,+fatigue ENT: -runny nose, -ear pain, -sore throat Cardiology:  -chest pain, -palpitations, -edema Respiratory: -cough, -shortness of breath, -wheezing Gastroenterology: -abdominal pain, -nausea, -vomiting, -diarrhea, -constipation  Hematology: -bleeding or bruising problems Musculoskeletal: -arthralgias, -myalgias, -joint swelling, -back pain Ophthalmology: -vision changes Urology: -dysuria, -difficulty urinating, -hematuria, -urinary frequency,  -urgency   Objective:  Filed Vitals:   09/08/13 1211  BP: 110/80  Pulse: 72  Temp: 97.6 F (36.4 C)  Resp: 14    General appearance: alert, no distress, WD/WN, lean white female HEENT: normocephalic, sclerae anicteric, PERRLA, EOMi, nares patent, no discharge or erythema, pharynx normal Oral cavity: MMM, no lesions Neck: supple, no lymphadenopathy, no thyromegaly, no masses, no JVD or bruits Heart: RRR, normal S1, S2, no murmurs Lungs: CTA bilaterally, no wheezes, rhonchi, or rales Musculoskeletal: nontender, no swelling, no obvious deformity Extremities: no edema, no cyanosis, no clubbing Pulses: 2+ symmetric, upper and lower extremities, normal cap refill Neurological: alert, oriented x 3, CN2-12 intact, strength normal upper extremities and lower extremities, sensation normal throughout, DTRs 2+ throughout, no cerebellar signs, gait normal Psychiatric: normal affect, behavior normal, pleasant    Adult ECG Report  Indication: TIA symptoms  Rate: 73 bpm  Rhythm: normal sinus rhythm  QRS Axis: 73 degrees  PR Interval:  QRS Duration: 84ms  QTc:  Conduction Disturbances: none  Other Abnormalities: none  Patient's cardiac risk factors are: OCP use.  EKG comparison: 2011, no change  Narrative Interpretation: normal EKG    Assessment: Encounter Diagnoses  Name Primary?  . Paresthesia Yes  . Light headedness   . Unilateral weakness   . Other malaise and fatigue    Plan: In general she has been healthy, low risk for cardiovascular or cerebrovascular issue.   Discussed symptoms, concerns, possible causes which could include TIA, cervical radicular symptoms, or other abnormality in the brain.  We will check labs today, EKG reviewed, will send for MRI brain.   Discussed  case with radiology and neuroradiology and MRI without contrast recommended.    Discussed signs and symptoms that would prompt a 911 call.

## 2013-09-09 ENCOUNTER — Ambulatory Visit
Admission: RE | Admit: 2013-09-09 | Discharge: 2013-09-09 | Disposition: A | Discharge status: Home / Self Care | Payer: BC Managed Care – PPO | Source: Ambulatory Visit | Attending: Medical | Admitting: Medical

## 2013-09-09 DIAGNOSIS — R202 Paresthesia of skin: Secondary | ICD-10-CM

## 2013-09-09 DIAGNOSIS — R531 Weakness: Secondary | ICD-10-CM

## 2013-09-09 DIAGNOSIS — R5383 Other fatigue: Secondary | ICD-10-CM

## 2013-09-09 DIAGNOSIS — R42 Dizziness and giddiness: Secondary | ICD-10-CM

## 2013-09-09 DIAGNOSIS — R5381 Other malaise: Secondary | ICD-10-CM

## 2013-09-12 ENCOUNTER — Other Ambulatory Visit: Payer: Self-pay | Admitting: Medical

## 2013-09-12 ENCOUNTER — Ambulatory Visit: Payer: Self-pay | Admitting: Medical

## 2013-09-12 DIAGNOSIS — R200 Anesthesia of skin: Secondary | ICD-10-CM

## 2013-09-12 DIAGNOSIS — R42 Dizziness and giddiness: Secondary | ICD-10-CM

## 2013-09-12 DIAGNOSIS — R202 Paresthesia of skin: Secondary | ICD-10-CM

## 2013-09-12 NOTE — Progress Notes (Signed)
Patient is aware of Grace Gates [facility] recommendations on the next steps. The patient states that she will have to check with her insurance carrier to check coverage first. She states that she will call me back and let me know her decision. CLS

## 2013-09-13 ENCOUNTER — Other Ambulatory Visit: Payer: Self-pay | Admitting: Family Medicine

## 2013-09-15 ENCOUNTER — Telehealth: Payer: Self-pay | Admitting: Medical

## 2013-09-15 NOTE — Telephone Encounter (Signed)
Rhonda with [facility] left message that patient is schedule for echo on Tuesday and that they need authorization  Please call Rhonda @ [phone]

## 2013-09-19 ENCOUNTER — Other Ambulatory Visit: Payer: Self-pay | Admitting: Family Medicine

## 2013-09-19 ENCOUNTER — Ambulatory Visit (HOSPITAL_BASED_OUTPATIENT_CLINIC_OR_DEPARTMENT_OTHER)
Admission: RE | Admit: 2013-09-19 | Discharge: 2013-09-19 | Disposition: A | Discharge status: Home / Self Care | Payer: BC Managed Care – PPO | Source: Ambulatory Visit | Attending: Internal Medicine | Admitting: Internal Medicine

## 2013-09-19 ENCOUNTER — Other Ambulatory Visit (HOSPITAL_COMMUNITY): Payer: Self-pay | Admitting: Medical

## 2013-09-19 ENCOUNTER — Ambulatory Visit (HOSPITAL_COMMUNITY)
Admission: RE | Admit: 2013-09-19 | Discharge: 2013-09-19 | Disposition: A | Discharge status: Home / Self Care | Payer: BC Managed Care – PPO | Source: Ambulatory Visit | Attending: Internal Medicine | Admitting: Internal Medicine

## 2013-09-19 DIAGNOSIS — R200 Anesthesia of skin: Secondary | ICD-10-CM

## 2013-09-19 DIAGNOSIS — R202 Paresthesia of skin: Secondary | ICD-10-CM

## 2013-09-19 DIAGNOSIS — I359 Nonrheumatic aortic valve disorder, unspecified: Secondary | ICD-10-CM

## 2013-09-19 DIAGNOSIS — G459 Transient cerebral ischemic attack, unspecified: Secondary | ICD-10-CM | POA: Diagnosis not present

## 2013-09-19 DIAGNOSIS — R42 Dizziness and giddiness: Secondary | ICD-10-CM

## 2013-09-19 DIAGNOSIS — R209 Unspecified disturbances of skin sensation: Secondary | ICD-10-CM

## 2013-09-19 NOTE — Progress Notes (Signed)
Carotid Duplex Completed. Bilateral 1-49% stenosis. Grace Gates,RVT

## 2013-09-19 NOTE — Progress Notes (Signed)
2D Echo Performed 09/19/2013    Berda Shelvin, RCS  

## 2013-10-05 ENCOUNTER — Encounter: Payer: Self-pay | Admitting: Medical

## 2016-01-31 ENCOUNTER — Telehealth: Payer: Self-pay | Admitting: Medical

## 2016-01-31 ENCOUNTER — Other Ambulatory Visit: Payer: Self-pay | Admitting: Medical

## 2016-01-31 DIAGNOSIS — Z0000 Encounter for general adult medical examination without abnormal findings: Secondary | ICD-10-CM

## 2016-01-31 NOTE — Telephone Encounter (Signed)
Pt coming in for CPE on 02/21/16 and she would like to have fwasting labs prior on 02/18/16. Is this ok?

## 2016-01-31 NOTE — Telephone Encounter (Signed)
I put lab orders in.  Needs to come in fasting for labs within 7 days of her physical appt

## 2016-02-18 ENCOUNTER — Other Ambulatory Visit: Payer: BC Managed Care – PPO

## 2016-02-20 ENCOUNTER — Other Ambulatory Visit: Payer: BC Managed Care – PPO

## 2016-02-20 DIAGNOSIS — Z0000 Encounter for general adult medical examination without abnormal findings: Secondary | ICD-10-CM

## 2016-02-20 LAB — CBC WITH DIFFERENTIAL/PLATELET
BASOS PCT: 1 %
Basophils Absolute: 41 cells/uL (ref 0–200)
EOS PCT: 5 %
Eosinophils Absolute: 205 cells/uL (ref 15–500)
HCT: 40.7 % (ref 35.0–45.0)
Hemoglobin: 13.5 g/dL (ref 11.7–15.5)
Lymphocytes Relative: 25 %
Lymphs Abs: 1025 cells/uL (ref 850–3900)
MCH: 29.4 pg (ref 27.0–33.0)
MCHC: 33.2 g/dL (ref 32.0–36.0)
MCV: 88.7 fL (ref 80.0–100.0)
MONOS PCT: 16 %
MPV: 8.9 fL (ref 7.5–12.5)
Monocytes Absolute: 656 cells/uL (ref 200–950)
NEUTROS ABS: 2173 {cells}/uL (ref 1500–7800)
Neutrophils Relative %: 53 %
PLATELETS: 181 10*3/uL (ref 140–400)
RBC: 4.59 MIL/uL (ref 3.80–5.10)
RDW: 14.4 % (ref 11.0–15.0)
WBC: 4.1 10*3/uL (ref 4.0–10.5)

## 2016-02-20 LAB — LIPID PANEL
Cholesterol: 180 mg/dL (ref <200)
HDL: 73 mg/dL (ref >50)
LDL Cholesterol: 96 mg/dL (ref <100)
TRIGLYCERIDES: 56 mg/dL (ref <150)
Total CHOL/HDL Ratio: 2.5 Ratio (ref <5.0)
VLDL: 11 mg/dL (ref <30)

## 2016-02-20 LAB — COMPREHENSIVE METABOLIC PANEL
ALBUMIN: 4.1 g/dL (ref 3.6–5.1)
ALK PHOS: 74 U/L (ref 33–130)
ALT: 13 U/L (ref 6–29)
AST: 21 U/L (ref 10–35)
BILIRUBIN TOTAL: 0.5 mg/dL (ref 0.2–1.2)
BUN: 14 mg/dL (ref 7–25)
CALCIUM: 9.2 mg/dL (ref 8.6–10.4)
CO2: 26 mmol/L (ref 20–31)
Chloride: 104 mmol/L (ref 98–110)
Creat: 0.99 mg/dL (ref 0.50–0.99)
Glucose, Bld: 93 mg/dL (ref 65–99)
Potassium: 4.4 mmol/L (ref 3.5–5.3)
Sodium: 139 mmol/L (ref 135–146)
Total Protein: 7 g/dL (ref 6.1–8.1)

## 2016-02-20 LAB — TSH: TSH: 1.46 m[IU]/L

## 2016-02-21 ENCOUNTER — Encounter: Payer: BC Managed Care – PPO | Admitting: Medical

## 2016-02-21 LAB — HEMOGLOBIN A1C
HEMOGLOBIN A1C: 5.4 % (ref <5.7)
Mean Plasma Glucose: 108 mg/dL

## 2016-02-25 ENCOUNTER — Ambulatory Visit (INDEPENDENT_AMBULATORY_CARE_PROVIDER_SITE_OTHER): Payer: BC Managed Care – PPO | Admitting: Medical

## 2016-02-25 ENCOUNTER — Encounter: Payer: Self-pay | Admitting: Medical

## 2016-02-25 VITALS — BP 110/68 | HR 69 | Ht 66.5 in | Wt 163.0 lb

## 2016-02-25 DIAGNOSIS — Z0000 Encounter for general adult medical examination without abnormal findings: Secondary | ICD-10-CM | POA: Diagnosis not present

## 2016-02-25 LAB — POCT URINALYSIS DIPSTICK
BILIRUBIN UA: NEGATIVE
Glucose, UA: NEGATIVE
Ketones, UA: NEGATIVE
LEUKOCYTES UA: NEGATIVE
NITRITE UA: NEGATIVE
PH UA: 7.5
PROTEIN UA: NEGATIVE
RBC UA: NEGATIVE
Spec Grav, UA: 1.015
UROBILINOGEN UA: NEGATIVE

## 2016-02-25 NOTE — Progress Notes (Signed)
Subjective:   HPI  Grace HeadsMary Gates is a 66 y.o. female who presents for a complete physical.    Dr. Juliene PinaMody, gyn Dentist Eye doctor Dr. Dulce Sellarutlaw, colonoscopy TYSINGER, DAVID Vincenza HewsSHANE, PA-C here for primary care  Last Td maybe within last 8 years Is up to date flu shot 09/2015 No prior shingles vaccine No prior pneumococcal vaccine Up to date on mammogram and pap smear through gynecology, has upcoming colonoscopy with Dr. Dulce Sellarutlaw  Exercise - 2 miles daily on treadmill at lunch.   Diet - sometimes great, sometimes falls apart.   She saw me for dizziness 2015, had eval, but had episode 2016 of intermittent dizziness that lasted throughout the day.  When she gets the dizziness, maybe some room spinning sensation, lightheaded.  If bend down and stand up quickly gets a little dizzy.  No ringing in ears, no hearing loss.   Does get some voice clearing, allegra seems to help.   Reviewed their medical, surgical, family, social, medication, and allergy history and updated chart as appropriate.  Past Medical History:  Diagnosis Date  . Allergy    seasonal  . Colon polyps   . H/O screening mammography    last 10/2010 along with pap smear  . Kidney donor   . Postmenopausal HRT (hormone replacement therapy)    [facility]  . Wears glasses    wears bifocals    Past Surgical History:  Procedure Laterality Date  . APPENDECTOMY  1973  . BUNIONECTOMY  2003   right foot with hammer toe surgery too  . CHOLECYSTECTOMY  1973  . COLONOSCOPY  01/2010   will be repeating 2018.   [facility]  . HAND SURGERY  2014   vascular cyst  . 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 MASS EXCISION  09/25/2011   Procedure: EXCISION MASS;  Surgeon: Nicki ReaperGary R Kuzma, MD;  Location: [facility];  Service: Orthopedics;  Laterality: Left;  excision cyst right ring finger and possible repair of extensor tendon wrist  . TUBAL LIGATION    . WISDOM TOOTH  EXTRACTION      Family History  Problem Relation Age of Onset  . Alcohol abuse Mother   . Cirrhosis Mother     died of cirrhosis  . Cancer Father     died of rare bone cancer  . Cancer Sister     breast cancer, ovarian cancer  . Heart disease Paternal Uncle     died of MI  . Stroke Neg Hx   . Hypertension Neg Hx   . Diabetes Neg Hx     Social History   Social History  . Marital status: Married    Spouse name: N/A  . Number of children: N/A  . Years of education: N/A   Occupational History  . [employer]   Social History Main Topics  . Smoking status: Former Smoker    Packs/day: 1.00    Years: 40.00    Quit date: 09/21/2008  . Smokeless tobacco: Never Used  . Alcohol use 2.0 oz/week    4 drink(s) per week  . Drug use: No  . Sexual activity: Not on file   Other Topics Concern  . Not on file   Social History Narrative   Married, 2 boys, exercise 5 days/wk, walking    Current Outpatient Prescriptions on File Prior to Visit  Medication Sig Dispense Refill  .  Estradiol 0.52 MG/0.87 GM (0.06%) GEL Place 1 Squirt onto the skin daily.      . fexofenadine (ALLEGRA) 30 MG tablet Take 30 mg by mouth 2 (two) times daily.    . progesterone (PROMETRIUM) 100 MG capsule Take 100 mg by mouth daily.       No current facility-administered medications on file prior to visit.     Allergies  Allergen Reactions  . Latex     rash   Review of Systems Constitutional: -fever, -chills, -sweats, -unexpected weight change, -anorexia, -fatigue Allergy: -sneezing, -itching, -congestion Dermatology: denies changing moles, rash, lumps, new worrisome lesions ENT: -runny nose, -ear pain, -sore throat, -hoarseness, -sinus pain, -teeth pain, -tinnitus, -hearing loss, -epistaxis Cardiology:  -chest pain, -palpitations, -edema, -orthopnea, -paroxysmal nocturnal dyspnea Respiratory: -cough, -shortness of breath, -dyspnea on exertion,  -wheezing, -hemoptysis Gastroenterology: -abdominal pain, -nausea, -vomiting, -diarrhea, -constipation, -blood in stool, -changes in bowel movement, -dysphagia Hematology: -bleeding or bruising problems Musculoskeletal: -arthralgias, -myalgias, -joint swelling, -back pain, -neck pain, -cramping, -gait changes Ophthalmology: -vision changes, -eye redness, -itching, -discharge Urology: -dysuria, -difficulty urinating, -hematuria, -urinary frequency, -urgency, incontinence Neurology: -headache, -weakness, -tingling, -numbness, -speech abnormality, -memory loss, -falls, -dizziness Psychology:  -depressed mood, -agitation, -sleep problems    Objective:   Physical Exam  BP 110/68   Pulse 69   Ht 5' 6.5" (1.689 m)   Wt 163 lb (73.9 kg)   SpO2 96%   BMI 25.91 kg/m   General appearance: alert, no distress, WD/WN, lean white female Skin: left lower jaw with 3mm raised round, somewhat uniform flesh colored lesion.  Similar smaller 2.5 mm lesion of right face, slightly furrowed, burt uniform flesh colored, otherwise skin unremarkable HEENT: normocephalic, conjunctiva/corneas normal, sclerae anicteric, PERRLA, EOMi, nares patent, no discharge or erythema, pharynx normal Oral cavity: MMM, tongue normal, teeth in good repair Neck: supple, no lymphadenopathy, no thyromegaly, no masses, normal ROM, no bruits Chest: non tender, normal shape and expansion Heart: RRR, normal S1, S2, no murmurs Lungs: CTA bilaterally, no wheezes, rhonchi, or rales Abdomen: +bs, right vertical and central vertical surgical scars, otherwise soft, non tender, non distended, no masses, no hepatomegaly, no splenomegaly, no bruits Back: non tender, normal ROM, no scoliosis Musculoskeletal: right dorsal hand along 4th metacarpal with, no obvious deformity, normal ROM throughout, lower extremities non tender, no obvious deformity, normal ROM throughout Extremities: no edema, no cyanosis, no clubbing Pulses: 2+ symmetric, upper  and lower extremities, normal cap refill Neurological: alert, oriented x 3, CN2-12 intact, strength normal upper extremities and lower extremities, sensation normal throughout, DTRs 2+ throughout, no cerebellar signs, gait normal Psychiatric: normal affect, behavior normal, pleasant  Breast/gyn/rectal - deferred to gynecology   Assessment and Plan :    Encounter Diagnosis  Name Primary?  . Routine general medical examination at a health care facility Yes   Physical exam - discussed healthy lifestyle, diet, exercise, preventative care, vaccinations, and addressed their concerns.   Reviewed recent labs she came in for, all normal See your eye doctor yearly for routine vision care. See your dentist yearly for routine dental care including hygiene visits twice yearly. See your gynecologist yearly for routine gynecological care. 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 F/u as planned soon with GI for consult, colonoscopy Dizziness - if this happens again, check pulse, BP, glucose if available to help rule out causes.  I suspect intermittent vertigo.  reviewed 2015 echo, carotid US, brain MRI. Advised yearly flu shot, plan for Tdap in the next 2 years,  she will get Shingrix when it becomes available, pneumococcal 15 at age 30yo. Follow-up pending labs.

## 2016-03-05 HISTORY — PX: COLONOSCOPY: SHX174

## 2016-03-10 LAB — HM COLONOSCOPY

## 2016-03-31 ENCOUNTER — Encounter: Payer: Self-pay | Admitting: Medical

## 2016-04-01 ENCOUNTER — Encounter: Payer: Self-pay | Admitting: Medical

## 2017-06-14 ENCOUNTER — Encounter: Payer: Self-pay | Admitting: Medical

## 2017-06-14 ENCOUNTER — Ambulatory Visit: Payer: BC Managed Care – PPO | Admitting: Medical

## 2017-06-14 VITALS — BP 108/78 | HR 66 | Temp 97.7°F | Ht 66.5 in | Wt 161.8 lb

## 2017-06-14 DIAGNOSIS — IMO0002 Reserved for concepts with insufficient information to code with codable children: Secondary | ICD-10-CM

## 2017-06-14 DIAGNOSIS — M7989 Other specified soft tissue disorders: Secondary | ICD-10-CM

## 2017-06-14 DIAGNOSIS — Q600 Renal agenesis, unilateral: Secondary | ICD-10-CM

## 2017-06-14 DIAGNOSIS — M25471 Effusion, right ankle: Secondary | ICD-10-CM

## 2017-06-14 NOTE — Progress Notes (Signed)
Subjective: Chief Complaint  Patient presents with  . Foot Swelling    right with stiffness   Here for right foot and ankle pain and swelling.  Started 5 days ago.  Got up and had pain without swelling 5 days ago, couldn't walk. This was out of the blue.  The next day had some puffiness, but could walk some normally the next day. Over the last 5 days has had swelling, and intermittent pain.  She notes similar symptom 1 other time in the past year.  No injury, no fall, no trauma.   No left foot issues.  No fever.  Has had some aches in right lower leg due to the way she is having to compensate.   Has had some tingling in right foot.   No CP, no SOB. No other aggravating or relieving factors. No other complaint.    Past Medical History:  Diagnosis Date  . Allergy    seasonal  . Colon polyps   . Dizziness 2015   eval with carotid dopplers, echo, brain MRI.  Marland Kitchen. H/O screening mammography    last 10/2010 along with pap smear  . Kidney donor   . Postmenopausal HRT (hormone replacement therapy)    [facility]  . Wears glasses    wears bifocals   Current Outpatient Medications on File Prior to Visit  Medication Sig Dispense Refill  . Estradiol 0.52 MG/0.87 GM (0.06%) GEL Place 1 Squirt onto the skin daily.      . fexofenadine (ALLEGRA) 30 MG tablet Take 30 mg by mouth 2 (two) times daily.    . progesterone (PROMETRIUM) 100 MG capsule Take 100 mg by mouth daily.       No current facility-administered medications on file prior to visit.     ROS as in subjective    Objective: BP 108/78   Pulse 66   Temp 97.7 F (36.5 C) (Oral)   Ht 5' 6.5" (1.689 m)   Wt 161 lb 12.8 oz (73.4 kg)   SpO2 97%   BMI 25.72 kg/m   Gen: wd, wn, nad Skin: unremarkable, no erythema, no bruising generalized mild swelling of right ankle and mid foot, slight area of fullness in right anterior ankle just distal to ATFL.  No bony abnormality.  No laxity, no deformity otherwise.  No bruising. Otherwise leg  nontender unremarkable. Legs neurovascularly intact   Assessment: Encounter Diagnoses  Name Primary?  Marland Kitchen. Foot swelling Yes  . Ankle swelling, right   . Solitary kidney     Plan: We discussed possible differential.  Without injury unclear was causing her swelling and pain.  Could be arthritis, could be gout could be injury that she did not realize.  Labs today, consider x-ray.  Discussed alternating ice and heat, leg elevation.  Tylenol for pain as needed.  Corrie DandyMary was seen today for foot swelling.  Diagnoses and all orders for this visit:  Foot swelling -     Uric acid -     Basic metabolic panel -     DG Foot Complete Right; Future -     DG Ankle Complete Right; Future  Ankle swelling, right -     Uric acid -     Basic metabolic panel -     DG Foot Complete Right; Future -     DG Ankle Complete Right; Future  Solitary kidney -     Uric acid -     Basic metabolic panel

## 2017-06-15 LAB — BASIC METABOLIC PANEL
BUN/Creatinine Ratio: 18 (ref 12–28)
BUN: 16 mg/dL (ref 8–27)
CO2: 25 mmol/L (ref 20–29)
Calcium: 9.6 mg/dL (ref 8.7–10.3)
Chloride: 102 mmol/L (ref 96–106)
Creatinine, Ser: 0.9 mg/dL (ref 0.57–1.00)
GFR, EST AFRICAN AMERICAN: 78 mL/min/{1.73_m2} (ref >59)
GFR, EST NON AFRICAN AMERICAN: 68 mL/min/{1.73_m2} (ref >59)
Glucose: 94 mg/dL (ref 65–99)
POTASSIUM: 5 mmol/L (ref 3.5–5.2)
SODIUM: 142 mmol/L (ref 134–144)

## 2017-06-15 LAB — URIC ACID: Uric Acid: 4.6 mg/dL (ref 2.5–7.1)

## 2017-06-16 ENCOUNTER — Ambulatory Visit
Admission: RE | Admit: 2017-06-16 | Discharge: 2017-06-16 | Disposition: A | Discharge status: Home / Self Care | Payer: BC Managed Care – PPO | Source: Ambulatory Visit | Attending: Medical | Admitting: Medical

## 2017-06-16 ENCOUNTER — Other Ambulatory Visit: Payer: Self-pay | Admitting: Medical

## 2017-06-16 DIAGNOSIS — M25471 Effusion, right ankle: Secondary | ICD-10-CM

## 2017-06-16 DIAGNOSIS — M7989 Other specified soft tissue disorders: Secondary | ICD-10-CM

## 2018-02-17 ENCOUNTER — Encounter: Payer: Self-pay | Admitting: Medical

## 2018-02-17 ENCOUNTER — Ambulatory Visit: Payer: BC Managed Care – PPO | Admitting: Medical

## 2018-02-17 VITALS — BP 122/72 | HR 69 | Temp 98.0°F | Ht 65.0 in | Wt 162.0 lb

## 2018-02-17 DIAGNOSIS — Z7185 Encounter for immunization safety counseling: Secondary | ICD-10-CM

## 2018-02-17 DIAGNOSIS — Z0000 Encounter for general adult medical examination without abnormal findings: Secondary | ICD-10-CM

## 2018-02-17 DIAGNOSIS — Z780 Asymptomatic menopausal state: Secondary | ICD-10-CM

## 2018-02-17 DIAGNOSIS — Z136 Encounter for screening for cardiovascular disorders: Secondary | ICD-10-CM | POA: Diagnosis not present

## 2018-02-17 DIAGNOSIS — Z23 Encounter for immunization: Secondary | ICD-10-CM | POA: Insufficient documentation

## 2018-02-17 DIAGNOSIS — Z7189 Other specified counseling: Secondary | ICD-10-CM | POA: Diagnosis not present

## 2018-02-17 DIAGNOSIS — Z13220 Encounter for screening for lipoid disorders: Secondary | ICD-10-CM

## 2018-02-17 DIAGNOSIS — Z905 Acquired absence of kidney: Secondary | ICD-10-CM

## 2018-02-17 LAB — POCT URINALYSIS DIP (PROADVANTAGE DEVICE)
Bilirubin, UA: NEGATIVE
Glucose, UA: NEGATIVE mg/dL
Ketones, POC UA: NEGATIVE mg/dL
Leukocytes, UA: NEGATIVE
Nitrite, UA: NEGATIVE
Protein Ur, POC: NEGATIVE mg/dL
RBC UA: NEGATIVE
Specific Gravity, Urine: 1.015
UUROB: NEGATIVE
pH, UA: 7 (ref 5.0–8.0)

## 2018-02-17 NOTE — Progress Notes (Addendum)
Subjective:   HPI  Grace Gates is a 68 y.o. female who presents for Chief Complaint  Patient presents with  . Annual Exam    Medical care team includes: Tysinger, Kermit Balo, PA-C here for primary care Dentist Eye doctor [facility]  Concerns: Lately a little winded going up hills, noticed this since turning 67yo.  Occasional has chest discomfort but this has occurred for years.  No leg swelling  Last Td about 8 years ago  Sees gyn next week for mammo, pap, and bone density test.   Reviewed their medical, surgical, family, social, medication, and allergy history and updated chart as appropriate.  Past Medical History:  Diagnosis Date  . Allergy    seasonal  . Colon polyps   . Dizziness 2015   eval with carotid dopplers, echo, brain MRI.  [facility] Kidney donor   . Postmenopausal HRT (hormone replacement therapy)    [facility]  . Wears glasses    wears bifocals    Past Surgical History:  Procedure Laterality Date  . APPENDECTOMY  1973  . BUNIONECTOMY  2003   right foot with hammer toe surgery too  . CHOLECYSTECTOMY  1973  . COLONOSCOPY  01/2010   will be repeating 2018.   Dr. Noe Gens, [facility]  . COLONOSCOPY  03/2016   Dr. Dulce Sellar, [facility]  . HAND SURGERY  2014   vascular cyst, right hand  . 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 MASS EXCISION  09/25/2011   Procedure: EXCISION MASS;  Surgeon: Nicki Reaper, MD;  Location:  SURGERY CENTER;  Service: Orthopedics;  Laterality: Left;  excision cyst right ring finger and possible repair of extensor tendon wrist  . TUBAL LIGATION    . WISDOM TOOTH EXTRACTION      Social History   Socioeconomic History  . Marital status: Married    Spouse name: Not on file  . Number of children: Not on file  . Years of education: Not on file  . Highest education level: Not on file  Occupational History  . 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 Financial resource strain: Not on file  . Food insecurity:    Worry: Not on file    Inability: Not on file  . Transportation needs:    Medical: Not on file    Non-medical: Not on file  Tobacco Use  . Smoking status: Former Smoker    Packs/day: 1.00    Years: 40.00    Pack years: 40.00    Last attempt to quit: 09/21/2008    Years since quitting: 9.4  . Smokeless tobacco: Never Used  Substance and Sexual Activity  . Alcohol use: Yes    Alcohol/week: 18.0 standard drinks    Types: 14 Glasses of wine, 4 Cans of beer per week  . Drug use: No  . Sexual activity: Not on file  Lifestyle  . Physical activity:    Days per week: Not on file    Minutes per session: Not on file  . Stress: Not on file  Relationships  . Social connections:    Talks on phone: Not on file    Gets together: Not on file    Attends religious service: Not on file    Active member of club or organization: Not on file    Attends meetings of clubs or organizations: Not on  file    Relationship status: Not on file  . Intimate partner violence:    Fear of current or ex partner: Not on file    Emotionally abused: Not on file    Physically abused: Not on file    Forced sexual activity: Not on file  Other Topics Concern  . Not on file  Social History Narrative   Married, 2 boys, goes to [employer] 3 times per week, treadmill and weight bearing exercise.  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  02/2018    Family History  Problem Relation Age of Onset  . Alcohol abuse Mother   . Cirrhosis Mother        died of cirrhosis  . Cancer Father        died of rare bone cancer  . Cancer Sister        endometrial cancer  . Cancer Sister        breast cancer, ovarian cancer  . Heart disease Paternal Uncle        died of MI  . Stroke Neg Hx   . Hypertension Neg Hx   . Diabetes Neg Hx      Current Outpatient Medications:  .  Estradiol 0.52 MG/0.87 GM (0.06%) GEL,  Place 1 Squirt onto the skin daily.  , Disp: , Rfl:  .  fexofenadine (ALLEGRA) 30 MG tablet, Take 30 mg by mouth 2 (two) times daily., Disp: , Rfl:  .  progesterone (PROMETRIUM) 100 MG capsule, Take 100 mg by mouth daily.  , Disp: , Rfl:   Allergies  Allergen Reactions  . Adhesive [Tape] Rash      Review of Systems Constitutional: -fever, -chills, -sweats, -unexpected weight change, -decreased appetite, -fatigue Allergy: -sneezing, -itching, -congestion Dermatology: -changing moles, --rash, -lumps ENT: -runny nose, -ear pain, -sore throat, -hoarseness, -sinus pain, -teeth pain, - ringing in ears, -hearing loss, -nosebleeds Cardiology: -chest pain, -palpitations, -swelling, -difficulty breathing when lying flat, -waking up short of breath Respiratory: -cough, -shortness of breath, -difficulty breathing with exercise or exertion, -wheezing, -coughing up blood Gastroenterology: -abdominal pain, -nausea, -vomiting, -diarrhea, -constipation, -blood in stool, -changes in bowel movement, -difficulty swallowing or eating Hematology: -bleeding, -bruising  Musculoskeletal: -joint aches, -muscle aches, -joint swelling, -back pain, -neck pain, -cramping, -changes in gait Ophthalmology: denies vision changes, eye redness, itching, discharge Urology: -burning with urination, -difficulty urinating, -blood in urine, -urinary frequency, -urgency, -incontinence Neurology: -headache, -weakness, -tingling, -numbness, -memory loss, -falls, -dizziness Psychology: -depressed mood, -agitation, -sleep problems Breast/gyn: -breast tenderness, -discharge, -lumps, -vaginal discharge,- irregular periods, -heavy periods     Objective:  BP 122/72   Pulse 69   Temp 98 F (36.7 C) (Oral)   Ht 5\' 5"  (1.651 m)   Wt 162 lb (73.5 kg)   SpO2 97%   BMI 26.96 kg/m   Wt Readings from Last 3 Encounters:  02/17/18 162 lb (73.5 kg)  06/14/17 161 lb 12.8 oz (73.4 kg)  02/25/16 163 lb (73.9 kg)    General appearance:  alert, no distress, WD/WN, Caucasian female Skin: scattered macules, no worrisome lesions  HEENT: normocephalic, conjunctiva/corneas normal, sclerae anicteric, PERRLA, EOMi, nares patent, no discharge or erythema, pharynx normal Oral cavity: MMM, tongue normal, teeth in good repair Neck: supple, no lymphadenopathy, no thyromegaly, no masses, normal ROM, no bruits Chest: non tender, normal shape and expansion Heart: RRR, normal S1, S2, no murmurs Lungs: CTA bilaterally, no wheezes, rhonchi, or rales Abdomen: +bs, soft, central surgical scars, otherwise non tender, non  distended, no masses, no hepatomegaly, no splenomegaly, no bruits Back: non tender, normal ROM, no scoliosis Musculoskeletal: upper extremities non tender, no obvious deformity, normal ROM throughout, lower extremities non tender, no obvious deformity, normal ROM throughout Extremities: no edema, no cyanosis, no clubbing Pulses: 2+ symmetric, upper and lower extremities, normal cap refill Neurological: alert, oriented x 3, CN2-12 intact, strength normal upper extremities and lower extremities, sensation normal throughout, DTRs 2+ throughout, no cerebellar signs, gait normal Psychiatric: normal affect, behavior normal, pleasant  Breast/gyn/rectal - deferred to gynecology    EKG Indication physical, normal sinus rhythm, rate 65 bpm, PR interval 174 ms, QRS 84 ms, QTC 428 ms, axis 74 degrees, normal EKG  Assessment and Plan :   Encounter Diagnoses  Name Primary?  . Routine general medical examination at a health care facility Yes  . Need for pneumococcal vaccination   . Vaccine counseling   . Screening for lipid disorders   . Screening for heart disease   . Post-menopausal   . History of kidney donation   . Single kidney     Physical exam - discussed and counseled on healthy lifestyle, diet, exercise, preventative care, vaccinations, sick and well care, proper use of emergency dept and after hours care, and addressed  their concerns.    Health screening: Advised they see their eye doctor yearly for routine vision care. Advised they see their dentist yearly for routine dental care including hygiene visits twice yearly. See your gynecologist yearly for routine gynecological care.  Cancer screening Counseled on self breast exams, mammograms, cervical cancer screening  Colonoscopy:  Reviewed colonoscopy on file that is up to date  Vaccinations: Advised yearly influenza vaccine  Counseled on Shingles vaccine at age 66 years and older Patient will check insurance coverage for this and consider vaccination  She reports last Td 8 years ago  Counseled on the pneumococcal vaccine.  Vaccine information sheet given.  Pneumococcal vaccine Prevnar 13 given after consent obtained.  Separate significant chronic issues discussed: Mild SOB, discussed exercise, increased intensity gradually, reviewed EKG and 2015 echo, but if symptoms continue or worsen, recheck  Corrie DandyMary was seen today for annual exam.  Diagnoses and all orders for this visit:  Routine general medical examination at a health care facility -     POCT Urinalysis DIP (Proadvantage Device) -     CBC with Differential/Platelet -     Comprehensive metabolic panel -     Lipid panel -     TSH -     EKG 12-Lead -     VITAMIN D 25 Hydroxy (Vit-D Deficiency, Fractures)  Need for pneumococcal vaccination  Vaccine counseling  Screening for lipid disorders  Screening for heart disease -     EKG 12-Lead  Post-menopausal  History of kidney donation  Single kidney    Follow-up pending labs, yearly for physical

## 2018-02-17 NOTE — Patient Instructions (Addendum)
Thanks for trusting us with your health care and for coming in for a physical today.  Below are some general recommendations I have for you:  Yearly screenings See your eye doctor yearly for routine vision care. See your dentist yearly for routine dental care including hygiene visits twice yearly. See me here yearly for a routine physical and preventative care visit Remind your gynecologist to send us a copy of mammogram, pap smear and bone density results Shingles vaccine:  I recommend you have a shingles vaccine to help prevent shingles or herpes zoster outbreak.   Please call your insurer to inquire about coverage for the Shingrix vaccine given in 2 doses.   Some insurers cover this vaccine after age 66, some cover this after age 460.  If your insurer covers this, then call to schedule appointment to have this vaccine here.   Please follow up yearly for a physical.    I have included other useful information below for your review.  Preventative Care for Adults - Female      MAINTAIN REGULAR HEALTH EXAMS:  A routine yearly physical is a good way to check in with your primary care provider about your health and preventive screening. It is also an opportunity to share updates about your health and any concerns you have, and receive a thorough all-over exam.   Most health insurance companies pay for at least some preventative services.  Check with your health plan for specific coverages.  WHAT PREVENTATIVE SERVICES DO WOMEN NEED?  Adult women should have their weight and blood pressure checked regularly.   Women age 66 and older should have their cholesterol levels checked regularly.  Women should be screened for cervical cancer with a Pap smear and pelvic exam beginning at either age 821, or 3 years after they become sexually activity.    Breast cancer screening generally begins at age 66 with a mammogram and breast exam by your primary care provider.    Beginning at age 66 and  continuing to age 66, women should be screened for colorectal cancer.  Certain people may need continued testing until age 66.  Updating vaccinations is part of preventative care.  Vaccinations help protect against diseases such as the flu.  Osteoporosis is a disease in which the bones lose minerals and strength as we age. Women ages 6365 and over should discuss this with their caregivers, as should women after menopause who have other risk factors.  Lab tests are generally done as part of preventative care to screen for anemia and blood disorders, to screen for problems with the kidneys and liver, to screen for bladder problems, to check blood sugar, and to check your cholesterol level.  Preventative services generally include counseling about diet, exercise, avoiding tobacco, drugs, excessive alcohol consumption, and sexually transmitted infections.    GENERAL RECOMMENDATIONS FOR GOOD HEALTH:  Healthy diet:  Eat a variety of foods, including fruit, vegetables, animal or vegetable protein, such as meat, fish, chicken, and eggs, or beans, lentils, tofu, and grains, such as rice.  Drink plenty of water daily.  Decrease saturated fat in the diet, avoid lots of red meat, processed foods, sweets, fast foods, and fried foods.  Exercise:  Aerobic exercise helps maintain good heart health. At least 30-40 minutes of moderate-intensity exercise is recommended. For example, a brisk walk that increases your heart rate and breathing. This should be done on most days of the week.   Find a type of exercise or a variety of exercises  that you enjoy so that it becomes a part of your daily life.  Examples are running, walking, swimming, water aerobics, and biking.  For motivation and support, explore group exercise such as aerobic class, spin class, Zumba, Yoga,or  martial arts, etc.    Set exercise goals for yourself, such as a certain weight goal, walk or run in a race such as a 5k walk/run.  Speak to your  primary care provider about exercise goals.  Disease prevention:  If you smoke or chew tobacco, find out from your caregiver how to quit. It can literally save your life, no matter how long you have been a tobacco user. If you do not use tobacco, never begin.   Maintain a healthy diet and normal weight. Increased weight leads to problems with blood pressure and diabetes.   The Body Mass Index or BMI is a way of measuring how much of your body is fat. Having a BMI above 27 increases the risk of heart disease, diabetes, hypertension, stroke and other problems related to obesity. Your caregiver can help determine your BMI and based on it develop an exercise and dietary program to help you achieve or maintain this important measurement at a healthful level.  High blood pressure causes heart and blood vessel problems.  Persistent high blood pressure should be treated with medicine if weight loss and exercise do not work.   Fat and cholesterol leaves deposits in your arteries that can block them. This causes heart disease and vessel disease elsewhere in your body.  If your cholesterol is found to be high, or if you have heart disease or certain other medical conditions, then you may need to have your cholesterol monitored frequently and be treated with medication.   Ask if you should have a cardiac stress test if your history suggests this. A stress test is a test done on a treadmill that looks for heart disease. This test can find disease prior to there being a problem.  Menopause can be associated with physical symptoms and risks. Hormone replacement therapy is available to decrease these. You should talk to your caregiver about whether starting or continuing to take hormones is right for you.   Osteoporosis is a disease in which the bones lose minerals and strength as we age. This can result in serious bone fractures. Risk of osteoporosis can be identified using a bone density scan. Women ages 47 and  over should discuss this with their caregivers, as should women after menopause who have other risk factors. Ask your caregiver whether you should be taking a calcium supplement and Vitamin D, to reduce the rate of osteoporosis.   Avoid drinking alcohol in excess (more than two drinks per day).  Avoid use of street drugs. Do not share needles with anyone. Ask for professional help if you need assistance or instructions on stopping the use of alcohol, cigarettes, and/or drugs.  Brush your teeth twice a day with fluoride toothpaste, and floss once a day. Good oral hygiene prevents tooth decay and gum disease. The problems can be painful, unattractive, and can cause other health problems. Visit your dentist for a routine oral and dental check up and preventive care every 6-12 months.   Look at your skin regularly.  Use a mirror to look at your back. Notify your caregivers of changes in moles, especially if there are changes in shapes, colors, a size larger than a pencil eraser, an irregular border, or development of new moles.  Safety:  Use  seatbelts 100% of the time, whether driving or as a passenger.  Use safety devices such as hearing protection if you work in environments with loud noise or significant background noise.  Use safety glasses when doing any work that could send debris in to the eyes.  Use a helmet if you ride a bike or motorcycle.  Use appropriate safety gear for contact sports.  Talk to your caregiver about gun safety.  Use sunscreen with a SPF (or skin protection factor) of 15 or greater.  Lighter skinned people are at a greater risk of skin cancer. Don't forget to also wear sunglasses in order to protect your eyes from too much damaging sunlight. Damaging sunlight can accelerate cataract formation.   Practice safe sex. Use condoms. Condoms are used for birth control and to help reduce the spread of sexually transmitted infections (or STIs).  Some of the STIs are gonorrhea (the clap),  chlamydia, syphilis, trichomonas, herpes, HPV (human papilloma virus) and HIV (human immunodeficiency virus) which causes AIDS. The herpes, HIV and HPV are viral illnesses that have no cure. These can result in disability, cancer and death.   Keep carbon monoxide and smoke detectors in your home functioning at all times. Change the batteries every 6 months or use a model that plugs into the wall.   Vaccinations:  Stay up to date with your tetanus shots and other required immunizations. You should have a booster for tetanus every 10 years. Be sure to get your flu shot every year, since 5%-20% of the [place] population comes down with the flu. The flu vaccine changes each year, so being vaccinated once is not enough. Get your shot in the fall, before the flu season peaks.   Other vaccines to consider:  Human Papilloma Virus or HPV causes cancer of the cervix, and other infections that can be transmitted from person to person. There is a vaccine for HPV, and females should get immunized between the ages of 19 and 65. It requires a series of 3 shots.   Pneumococcal vaccine to protect against certain types of pneumonia.  This is normally recommended for adults age 87 or older.  However, adults younger than 66 years old with certain underlying conditions such as diabetes, heart or lung disease should also receive the vaccine.  Shingles vaccine to protect against Varicella Zoster if you are older than age 54, or younger than 66 years old with certain underlying illness.  If you have not had the Shingrix vaccine, please call your insurer to inquire about coverage for the Shingrix vaccine given in 2 doses.   Some insurers cover this vaccine after age 4, some cover this after age 87.  If your insurer covers this, then call to schedule appointment to have this vaccine here  Hepatitis A vaccine to protect against a form of infection of the liver by a virus acquired from food.  Hepatitis B vaccine to protect  against a form of infection of the liver by a virus acquired from blood or body fluids, particularly if you work in health care.  If you plan to travel internationally, check with your local health department for specific vaccination recommendations.  Cancer Screening:  Breast cancer screening is essential to preventive care for women. All women age 47 and older should perform a breast self-exam every month. At age 39 and older, women should have their caregiver complete a breast exam each year. Women at ages 65 and older should have a mammogram (x-ray film) of the  breasts. Your caregiver can discuss how often you need mammograms.    Cervical cancer screening includes taking a Pap smear (sample of cells examined under a microscope) from the cervix (end of the uterus). It also includes testing for HPV (Human Papilloma Virus, which can cause cervical cancer). Screening and a pelvic exam should begin at age 64, or 3 years after a woman becomes sexually active. Screening should occur every year, with a Pap smear but no HPV testing, up to age 37. After age 26, you should have a Pap smear every 3 years with HPV testing, if no HPV was found previously.   Most routine colon cancer screening begins at the age of 35. On a yearly basis, doctors may provide special easy to use take-home tests to check for hidden blood in the stool. Sigmoidoscopy or colonoscopy can detect the earliest forms of colon cancer and is life saving. These tests use a small camera at the end of a tube to directly examine the colon. Speak to your caregiver about this at age 69, when routine screening begins (and is repeated every 5 years unless early forms of pre-cancerous polyps or small growths are found).

## 2018-02-17 NOTE — Addendum Note (Signed)
Addended by: Victorio Palm on: 02/17/2018 09:32 AM   Modules accepted: Orders

## 2018-02-18 ENCOUNTER — Telehealth: Payer: Self-pay

## 2018-02-18 ENCOUNTER — Other Ambulatory Visit: Payer: Self-pay | Admitting: Medical

## 2018-02-18 DIAGNOSIS — E875 Hyperkalemia: Secondary | ICD-10-CM

## 2018-02-18 LAB — COMPREHENSIVE METABOLIC PANEL
ALT: 15 IU/L (ref 0–32)
AST: 23 IU/L (ref 0–40)
Albumin/Globulin Ratio: 1.7 (ref 1.2–2.2)
Albumin: 4.5 g/dL (ref 3.8–4.8)
Alkaline Phosphatase: 82 IU/L (ref 39–117)
BUN/Creatinine Ratio: 13 (ref 12–28)
BUN: 13 mg/dL (ref 8–27)
Bilirubin Total: 0.3 mg/dL (ref 0.0–1.2)
CHLORIDE: 104 mmol/L (ref 96–106)
CO2: 22 mmol/L (ref 20–29)
Calcium: 9.8 mg/dL (ref 8.7–10.3)
Creatinine, Ser: 0.97 mg/dL (ref 0.57–1.00)
GFR calc Af Amer: 71 mL/min/{1.73_m2} (ref >59)
GFR calc non Af Amer: 61 mL/min/{1.73_m2} (ref >59)
Globulin, Total: 2.7 g/dL (ref 1.5–4.5)
Glucose: 105 mg/dL — ABNORMAL HIGH (ref 65–99)
Potassium: 5.3 mmol/L — ABNORMAL HIGH (ref 3.5–5.2)
Sodium: 142 mmol/L (ref 134–144)
Total Protein: 7.2 g/dL (ref 6.0–8.5)

## 2018-02-18 LAB — TSH: TSH: 1.24 u[IU]/mL (ref 0.450–4.500)

## 2018-02-18 LAB — LIPID PANEL
Chol/HDL Ratio: 2.9 ratio (ref 0.0–4.4)
Cholesterol, Total: 204 mg/dL — ABNORMAL HIGH (ref 100–199)
HDL: 70 mg/dL (ref >39)
LDL Calculated: 123 mg/dL — ABNORMAL HIGH (ref 0–99)
Triglycerides: 56 mg/dL (ref 0–149)
VLDL Cholesterol Cal: 11 mg/dL (ref 5–40)

## 2018-02-18 LAB — CBC WITH DIFFERENTIAL/PLATELET
Basophils Absolute: 0.1 10*3/uL (ref 0.0–0.2)
Basos: 1 %
EOS (ABSOLUTE): 0.2 10*3/uL (ref 0.0–0.4)
Eos: 4 %
Hematocrit: 40.9 % (ref 34.0–46.6)
Hemoglobin: 13.1 g/dL (ref 11.1–15.9)
Immature Grans (Abs): 0 10*3/uL (ref 0.0–0.1)
Immature Granulocytes: 0 %
Lymphocytes Absolute: 0.8 10*3/uL (ref 0.7–3.1)
Lymphs: 20 %
MCH: 28.6 pg (ref 26.6–33.0)
MCHC: 32 g/dL (ref 31.5–35.7)
MCV: 89 fL (ref 79–97)
MONOS ABS: 0.6 10*3/uL (ref 0.1–0.9)
Monocytes: 15 %
Neutrophils Absolute: 2.3 10*3/uL (ref 1.4–7.0)
Neutrophils: 60 %
PLATELETS: 213 10*3/uL (ref 150–450)
RBC: 4.58 x10E6/uL (ref 3.77–5.28)
RDW: 12.5 % (ref 11.7–15.4)
WBC: 3.9 10*3/uL (ref 3.4–10.8)

## 2018-02-18 LAB — VITAMIN D 25 HYDROXY (VIT D DEFICIENCY, FRACTURES): Vit D, 25-Hydroxy: 35.9 ng/mL (ref 30.0–100.0)

## 2018-02-18 NOTE — Telephone Encounter (Signed)
Patient called and stated her insurance informed her that they will cover her Shingrix vaccines. Patient has scheduled her appt for her first shingrix shot.

## 2018-02-23 LAB — HM MAMMOGRAPHY

## 2018-02-24 ENCOUNTER — Other Ambulatory Visit (INDEPENDENT_AMBULATORY_CARE_PROVIDER_SITE_OTHER): Payer: BC Managed Care – PPO

## 2018-02-24 DIAGNOSIS — Z23 Encounter for immunization: Secondary | ICD-10-CM | POA: Diagnosis not present

## 2018-02-25 ENCOUNTER — Encounter: Payer: Self-pay | Admitting: Internal Medicine

## 2018-03-17 LAB — HM DEXA SCAN

## 2018-03-22 ENCOUNTER — Other Ambulatory Visit: Payer: BC Managed Care – PPO

## 2018-03-23 ENCOUNTER — Telehealth: Payer: Self-pay | Admitting: Medical

## 2018-03-23 NOTE — Telephone Encounter (Signed)
Your bone density shows osteopenia or low bone mass.  I recommend they exercise regularly including aerobic and weight bearing exercise.   Weight-bearing physical activity and exercises that improve balance and posture can strengthen bones and reduce the chance of a fracture. The more active and fit you are as you age, the less likely you are to fall and break a bone.   Good nutrition. Eat a healthy diet and make certain that you're getting 1200mg  of calcium daily in the diet from dairy (typically 4 servings) or supplements OTC.   Also they should be getting Vitamin D through eating fish, seafood, getting sun exposure, and using either OTC Vitamin D supplement such as 1000 IU daily   Lets plan to repeat the bone density study in 2-3 years.

## 2018-03-23 NOTE — Telephone Encounter (Signed)
Patient notified of bone density and recommendations.

## 2018-03-31 ENCOUNTER — Encounter: Payer: Self-pay | Admitting: Medical

## 2018-04-26 ENCOUNTER — Other Ambulatory Visit: Payer: BC Managed Care – PPO

## 2018-04-26 ENCOUNTER — Other Ambulatory Visit: Payer: BC Managed Care – PPO | Admitting: Medical

## 2018-04-29 ENCOUNTER — Other Ambulatory Visit (INDEPENDENT_AMBULATORY_CARE_PROVIDER_SITE_OTHER): Payer: BC Managed Care – PPO

## 2018-04-29 ENCOUNTER — Other Ambulatory Visit: Payer: Self-pay

## 2018-04-29 DIAGNOSIS — Z23 Encounter for immunization: Secondary | ICD-10-CM

## 2018-05-25 ENCOUNTER — Other Ambulatory Visit: Payer: BC Managed Care – PPO

## 2018-08-26 ENCOUNTER — Other Ambulatory Visit: Payer: Self-pay

## 2018-08-26 ENCOUNTER — Other Ambulatory Visit (INDEPENDENT_AMBULATORY_CARE_PROVIDER_SITE_OTHER): Payer: BC Managed Care – PPO

## 2018-08-26 DIAGNOSIS — Z23 Encounter for immunization: Secondary | ICD-10-CM | POA: Diagnosis not present

## 2019-04-11 LAB — HM MAMMOGRAPHY

## 2019-04-20 ENCOUNTER — Other Ambulatory Visit: Payer: Self-pay

## 2019-04-20 ENCOUNTER — Encounter: Payer: Self-pay | Admitting: Medical

## 2019-04-20 ENCOUNTER — Ambulatory Visit: Payer: BC Managed Care – PPO | Admitting: Medical

## 2019-04-20 VITALS — BP 138/70 | HR 79 | Temp 98.0°F | Ht 65.0 in | Wt 164.0 lb

## 2019-04-20 DIAGNOSIS — G8929 Other chronic pain: Secondary | ICD-10-CM | POA: Insufficient documentation

## 2019-04-20 DIAGNOSIS — M79671 Pain in right foot: Secondary | ICD-10-CM | POA: Insufficient documentation

## 2019-04-20 DIAGNOSIS — R202 Paresthesia of skin: Secondary | ICD-10-CM

## 2019-04-20 DIAGNOSIS — M25571 Pain in right ankle and joints of right foot: Secondary | ICD-10-CM | POA: Diagnosis not present

## 2019-04-20 NOTE — Progress Notes (Signed)
Subjective: Chief Complaint  Patient presents with  . Foot Pain    right foot    Here for right foot pain.   Had xrays here 2 years ago for pain, but xrays were normal.  Every now and then gets pain across top of feet.  6 months ago had similar pain, couldn't walk on it a couple of days.   Since then has back and forth issues.   In the last month, when she drives, gets pains in front of ankle/upper foot.   In the last week foot can get a little numb and tingly.   Yesterday this sensation was going up the lower right leg.     No recent injury, no trauma, no fall.     Past Medical History:  Diagnosis Date  . Allergy    seasonal  . Colon polyps   . Dizziness 2015   eval with carotid dopplers, echo, brain MRI.  [facility] Kidney donor   . Postmenopausal HRT (hormone replacement therapy)    [facility]  . Wears glasses    wears bifocals   Current Outpatient Medications on File Prior to Visit  Medication Sig Dispense Refill  . Estradiol 0.52 MG/0.87 GM (0.06%) GEL Place 1 Squirt onto the skin daily.      . fexofenadine (ALLEGRA) 30 MG tablet Take 30 mg by mouth 2 (two) times daily.    . progesterone (PROMETRIUM) 100 MG capsule Take 100 mg by mouth daily.       No current facility-administered medications on file prior to visit.   ROS as in subjective    Objective: BP 138/70   Pulse 79   Temp 98 F (36.7 C)   Ht 5\' 5"  (1.651 m)   Wt 164 lb (74.4 kg)   SpO2 95%   BMI 27.29 kg/m   Gen: wd, wn, nad There is bony deformity of the right third toe at the PIP, prior bunion surgery right great toe Ankle and foot nontender, no obvious other deformity, no swelling, no laxity Legs neurovascularly intact, normal pedal pulses normal foot sensation  Dr. , supervising physician also examined her foot    Assessment: Encounter Diagnoses  Name Primary?  . Right foot pain Yes  . Chronic pain of right ankle   . Paresthesia of right foot      Plan: No obvious cause of her  symptoms currently we will send for x-ray to compare to 2019 x-rays.  Discussed possible differentials for paresthesias and pain of right foot and anterior ankle, arthritis, talar dome dysfunction, neuropathy, ?  Raschelle was seen today for foot pain.  Diagnoses and all orders for this visit:  Right foot pain -     DG Ankle Complete Right; Future -     DG Foot Complete Right; Future  Chronic pain of right ankle -     DG Ankle Complete Right; Future -     DG Foot Complete Right; Future  Paresthesia of right foot

## 2019-04-20 NOTE — Patient Instructions (Signed)
Please go to [facility] for your foot and ankle xray.   Their hours are 8am - 4:30 pm Monday - Friday.  Take your insurance card with you.  [facility] [phone]  [address], [address] [place], [place] [address]  [address] [place], [place] [address]

## 2019-04-21 ENCOUNTER — Ambulatory Visit
Admission: RE | Admit: 2019-04-21 | Discharge: 2019-04-21 | Disposition: A | Discharge status: Home / Self Care | Payer: BC Managed Care – PPO | Source: Ambulatory Visit | Attending: Medical | Admitting: Medical

## 2019-04-21 DIAGNOSIS — M79671 Pain in right foot: Secondary | ICD-10-CM

## 2019-04-21 DIAGNOSIS — G8929 Other chronic pain: Secondary | ICD-10-CM

## 2019-04-21 DIAGNOSIS — M25571 Pain in right ankle and joints of right foot: Secondary | ICD-10-CM

## 2019-05-05 ENCOUNTER — Encounter: Payer: Self-pay | Admitting: Medical

## 2020-04-24 LAB — HM MAMMOGRAPHY

## 2020-04-24 LAB — HM PAP SMEAR: HM Pap smear: NEGATIVE

## 2020-08-20 ENCOUNTER — Encounter: Payer: Self-pay | Admitting: Medical

## 2020-08-20 ENCOUNTER — Ambulatory Visit (INDEPENDENT_AMBULATORY_CARE_PROVIDER_SITE_OTHER): Payer: BC Managed Care – PPO | Admitting: Medical

## 2020-08-20 ENCOUNTER — Other Ambulatory Visit: Payer: Self-pay

## 2020-08-20 VITALS — BP 120/80 | HR 64 | Ht 65.5 in | Wt 164.6 lb

## 2020-08-20 DIAGNOSIS — Z0000 Encounter for general adult medical examination without abnormal findings: Secondary | ICD-10-CM

## 2020-08-20 DIAGNOSIS — Z1211 Encounter for screening for malignant neoplasm of colon: Secondary | ICD-10-CM | POA: Insufficient documentation

## 2020-08-20 DIAGNOSIS — Z87891 Personal history of nicotine dependence: Secondary | ICD-10-CM

## 2020-08-20 DIAGNOSIS — Z1159 Encounter for screening for other viral diseases: Secondary | ICD-10-CM

## 2020-08-20 DIAGNOSIS — Z122 Encounter for screening for malignant neoplasm of respiratory organs: Secondary | ICD-10-CM

## 2020-08-20 DIAGNOSIS — R21 Rash and other nonspecific skin eruption: Secondary | ICD-10-CM

## 2020-08-20 DIAGNOSIS — M8589 Other specified disorders of bone density and structure, multiple sites: Secondary | ICD-10-CM

## 2020-08-20 DIAGNOSIS — Z13220 Encounter for screening for lipoid disorders: Secondary | ICD-10-CM

## 2020-08-20 DIAGNOSIS — Z780 Asymptomatic menopausal state: Secondary | ICD-10-CM

## 2020-08-20 DIAGNOSIS — E2839 Other primary ovarian failure: Secondary | ICD-10-CM

## 2020-08-20 DIAGNOSIS — Z7185 Encounter for immunization safety counseling: Secondary | ICD-10-CM

## 2020-08-20 DIAGNOSIS — Z860100 Personal history of colon polyps, unspecified: Secondary | ICD-10-CM | POA: Insufficient documentation

## 2020-08-20 DIAGNOSIS — Z23 Encounter for immunization: Secondary | ICD-10-CM | POA: Diagnosis not present

## 2020-08-20 DIAGNOSIS — Z136 Encounter for screening for cardiovascular disorders: Secondary | ICD-10-CM

## 2020-08-20 DIAGNOSIS — H259 Unspecified age-related cataract: Secondary | ICD-10-CM | POA: Insufficient documentation

## 2020-08-20 MED ORDER — CLOBETASOL PROPIONATE 0.05 % EX OINT: 1.0000 "application " | TOPICAL_OINTMENT | Freq: Two times a day (BID) | CUTANEOUS | 0 refills | Status: DC

## 2020-08-20 NOTE — Progress Notes (Signed)
Subjective:   HPI  Grace Gates is a 70 y.o. female who presents for Chief Complaint  Patient presents with   fasting cpe    Fasting cpe , spot on back that she wants looked at     Patient Care Team: Dyane Broberg, Cleda Mccreedy as PCP - General Sees dentist Sees eye doctor Dr. Willis Modena, GI Dr. Shea Evans, gyn dermatology  Concerns: None  Recent diagnosis of cataract.  Back in June got bit on right upper back, not a mosquito but not sure.  Had whelp and lots of itching.  After 4 days got rash.  Been using triamcinolone 0.1% cream from dermatology on this.  Reviewed their medical, surgical, family, social, medication, and allergy history and updated chart as appropriate.  Past Medical History:  Diagnosis Date   Allergy    seasonal   Colon polyps    Dizziness 2015   eval with carotid dopplers, echo, brain MRI.   Kidney donor    Postmenopausal HRT (hormone replacement therapy)    [facility]   Wears glasses    wears bifocals    Family History  Problem Relation Age of Onset   Alcohol abuse Mother    Cirrhosis Mother        died of cirrhosis   Cancer Father        died of rare bone cancer   Cancer Sister        endometrial cancer   Cancer Sister        breast cancer, ovarian cancer   Heart disease Paternal Uncle        died of MI   Stroke Neg Hx    Hypertension Neg Hx    Diabetes Neg Hx      Current Outpatient Medications:    clobetasol ointment (TEMOVATE) 0.05 %, Apply 1 application topically 2 (two) times daily., Disp: 30 g, Rfl: 0   Estradiol 0.52 MG/0.87 GM (0.06%) GEL, Place 1 Squirt onto the skin daily.  , Disp: , Rfl:    fexofenadine (ALLEGRA) 30 MG tablet, Take 30 mg by mouth 2 (two) times daily., Disp: , Rfl:    progesterone (PROMETRIUM) 100 MG capsule, Take 100 mg by mouth daily.  , Disp: , Rfl:   Allergies  Allergen Reactions   Adhesive [Tape] Rash      Review of Systems Constitutional: -fever, -chills, -sweats, -unexpected  weight change, -decreased appetite, -fatigue Allergy: -sneezing, +itching, -congestion Dermatology: -changing moles, +rash, -lumps ENT: -runny nose, -ear pain, -sore throat, -hoarseness, -sinus pain, -teeth pain, - ringing in ears, -hearing loss, -nosebleeds Cardiology: -chest pain, -palpitations, -swelling, -difficulty breathing when lying flat, -waking up short of breath Respiratory: -cough, -shortness of breath, -difficulty breathing with exercise or exertion, -wheezing, -coughing up blood Gastroenterology: -abdominal pain, -nausea, -vomiting, -diarrhea, -constipation, -blood in stool, -changes in bowel movement, -difficulty swallowing or eating Hematology: -bleeding, -bruising  Musculoskeletal: -joint aches, -muscle aches, -joint swelling, -back pain, -neck pain, -cramping, -changes in gait Ophthalmology: denies vision changes, eye redness, itching, discharge Urology: -burning with urination, -difficulty urinating, -blood in urine, -urinary frequency, -urgency, -incontinence Neurology: -headache, -weakness, -tingling, -numbness, -memory loss, -falls, -dizziness Psychology: -depressed mood, -agitation, -sleep problems Breast/gyn: -breast tendnerss, -discharge, -lumps, -vaginal discharge,- irregular periods, -heavy periods   Depression screen [facility] 2/9 08/20/2020 02/17/2018 02/25/2016  Decreased Interest 0 0 0  Down, Depressed, Hopeless 0 0 0  PHQ - 2 Score 0 0 0       Objective:  BP 120/80   Pulse  64   Ht 5' 5.5" (1.664 m)   Wt 164 lb 9.6 oz (74.7 kg)   BMI 26.97 kg/m   General appearance: alert, no distress, WD/WN, Caucasian female Skin: mild pink flat rash right upper back.  Right upper anterior chest just inferior to clavicle with 3cm diameter spongy lump chronic per patient suggestive of lipoma.  No other worrisome lesions HEENT: normocephalic, conjunctiva/corneas normal, sclerae anicteric, PERRLA, EOMi, nares patent, no discharge or erythema, pharynx normal Neck: supple, no  lymphadenopathy, no thyromegaly, no masses, normal ROM, no bruits Chest: non tender, normal shape and expansion Heart: RRR, normal S1, S2, no murmurs Lungs: CTA bilaterally, no wheezes, rhonchi, or rales Abdomen: +bs, soft, upper abdomen surgical scars from prior kidney donor surgery, otherwise non tender, non distended, no masses, no hepatomegaly, no splenomegaly, no bruits Back: non tender, normal ROM, no scoliosis Musculoskeletal: upper extremities non tender, no obvious deformity, normal ROM throughout, lower extremities non tender, no obvious deformity, normal ROM throughout Extremities: no edema, no cyanosis, no clubbing Pulses: 2+ symmetric, upper and lower extremities, normal cap refill Neurological: alert, oriented x 3, CN2-12 intact, strength normal upper extremities and lower extremities, sensation normal throughout, DTRs 2+ throughout, no cerebellar signs, gait normal Psychiatric: normal affect, behavior normal, pleasant  Breast/gyn/rectal - deferred to gynecology     Assessment and Plan :   Encounter Diagnoses  Name Primary?   Encounter for health maintenance examination in adult Yes   Need for Tdap vaccination    Screen for colon cancer    History of colon polyps    Vaccine counseling    Post-menopausal    Estrogen deficiency    Osteopenia of multiple sites    Senile cataract, unspecified age-related cataract type, unspecified laterality    Rash    Former smoker    Screening for heart disease    Screening for lung cancer      This visit was a preventative care visit, also known as wellness visit or routine physical.   Topics typically include healthy lifestyle, diet, exercise, preventative care, vaccinations, sick and well care, proper use of emergency dept and after hours care, as well as other concerns.     Recommendations: Continue to return yearly for your annual wellness and preventative care visits.  This gives us a chance to discuss healthy lifestyle,  exercise, vaccinations, review your chart record, and perform screenings where appropriate.  I recommend you see your eye doctor yearly for routine vision care.  I recommend you see your dentist yearly for routine dental care including hygiene visits twice yearly.   Vaccination recommendations were reviewed Immunization History  Administered Date(s) Administered   Fluad Quad(high Dose 65+) 08/26/2018   Influenza, High Dose Seasonal PF 10/07/2017   Moderna Sars-Covid-2 Vaccination 02/10/2019, 03/11/2019, 10/18/2019   Pneumococcal Conjugate-13 02/17/2018   Tdap 08/20/2020   Zoster Recombinat (Shingrix) 02/24/2018, 04/29/2018    Counseled on the Tdap (tetanus, diptheria, and acellular pertussis) vaccine.  Vaccine information sheet given. Tdap vaccine given after consent obtained.  I recommend yearly flu shot in the fall   Screening for cancer: Colon cancer screening: I recommend you check with Dr. Hulen Shoutsutlaw's office.  I think you are due for repeat colonoscopy 2023.  Breast cancer screening: You should perform a self breast exam monthly.   We reviewed recommendations for regular mammograms and breast cancer screening.  Cervical cancer screening: We reviewed recommendations for pap smear screening.   Skin cancer screening: Check your skin regularly for new changes, growing  lesions, or other lesions of concern Come in for evaluation if you have skin lesions of concern.  Lung cancer screening: If you have a greater than 20 pack year history of tobacco use, then you may qualify for lung cancer screening with a chest CT scan.   Please call your insurance company to inquire about coverage for this test.  We currently don't have screenings for other cancers besides breast, cervical, colon, and lung cancers.  If you have a strong family history of cancer or have other cancer screening concerns, please let me know.    Bone health: Get at least 150 minutes of aerobic exercise weekly Get  weight bearing exercise at least once weekly Bone density test:  A bone density test is an imaging test that uses a type of X-ray to measure the amount of calcium and other minerals in your bones. The test may be used to diagnose or screen you for a condition that causes weak or thin bones (osteoporosis), predict your risk for a broken bone (fracture), or determine how well your osteoporosis treatment is working. The bone density test is recommended for females 65 and older, or females or males <65 if certain risk factors such as thyroid disease, long term use of steroids such as for asthma or rheumatological issues, vitamin D deficiency, estrogen deficiency, family history of osteoporosis, self or family history of fragility fracture in first degree relative.  Call [facility] to schedule your updated Bone Density test   Heart health: Get at least 150 minutes of aerobic exercise weekly Limit alcohol It is important to maintain a healthy blood pressure and healthy cholesterol numbers  Heart disease screening: Screening for heart disease includes screening for blood pressure, fasting lipids, glucose/diabetes screening, BMI height to weight ratio, reviewed of smoking status, physical activity, and diet.    Goals include blood pressure 120/80 or less, maintaining a healthy lipid/cholesterol profile, preventing diabetes or keeping diabetes numbers under good control, not smoking or using tobacco products, exercising most days per week or at least 150 minutes per week of exercise, and eating healthy variety of fruits and vegetables, healthy oils, and avoiding unhealthy food choices like fried food, fast food, high sugar and high cholesterol foods.    I am referring you for CT coronary calcium score test.   Medical care options: I recommend you continue to seek care here first for routine care.  We try really hard to have available appointments Monday through Friday daytime hours for sick visits, acute  visits, and physicals.  Urgent care should be used for after hours and weekends for significant issues that cannot wait till the next day.  The emergency department should be used for significant potentially life-threatening emergencies.  The emergency department is expensive, can often have long wait times for less significant concerns, so try to utilize primary care, urgent care, or telemedicine when possible to avoid unnecessary trips to the emergency department.  Virtual visits and telemedicine have been introduced since the pandemic started in 2020, and can be convenient ways to receive medical care.  We offer virtual appointments as well to assist you in a variety of options to seek medical care.   Advanced Directives: I recommend you consider completing a Health Care Power of Attorney and Living Will.   These documents respect your wishes and help alleviate burdens on your loved ones if you were to become terminally ill or be in a position to need those documents enforced.    You can complete  Advanced Directives yourself, have them notarized, then have copies made for our office, for you and for anybody you feel should have them in safe keeping.  Or, you can have an attorney prepare these documents.   If you haven't updated your Last Will and Testament in a while, it may be worthwhile having an attorney prepare these documents together and save on some costs.       Separate significant issues discussed: Rash - try the clobetasol ointment to the rash for up to 10 days.   If not resolved, then let me know or follow up with dermatology.  Don't continue using this ointment > 10 days.  Osteopenia - continue weight bearing and aerobic exercise.    Continue vitamin D supplement.    Lets get you scheduled for updated bone density test     Dalinda was seen today for fasting cpe.  Diagnoses and all orders for this visit:  Encounter for health maintenance examination in adult -     Comprehensive  metabolic panel -     CBC -     Lipid panel -     VITAMIN D 25 Hydroxy (Vit-D Deficiency, Fractures) -     Hepatitis C antibody -     DG Bone Density; Future -     CT CARDIAC SCORING ([facility] LOCATIONS ONLY); Future  Need for Tdap vaccination  Screen for colon cancer  History of colon polyps  Vaccine counseling  Post-menopausal -     DG Bone Density; Future  Estrogen deficiency -     DG Bone Density; Future  Osteopenia of multiple sites -     DG Bone Density; Future  Senile cataract, unspecified age-related cataract type, unspecified laterality  Rash  Former smoker -     CT CARDIAC SCORING ([facility] LOCATIONS ONLY); Future  Screening for heart disease -     CT CARDIAC SCORING ([facility] LOCATIONS ONLY); Future  Screening for lung cancer -     CT CARDIAC SCORING ([facility] LOCATIONS ONLY); Future  Other orders -     Tdap vaccine greater than or equal to 7yo IM -     clobetasol ointment (TEMOVATE) 0.05 %; Apply 1 application topically 2 (two) times daily.   Follow-up pending labs, yearly for physical

## 2020-08-21 ENCOUNTER — Encounter: Payer: Self-pay | Admitting: Internal Medicine

## 2020-08-21 LAB — COMPREHENSIVE METABOLIC PANEL
ALT: 14 IU/L (ref 0–32)
AST: 19 IU/L (ref 0–40)
Albumin/Globulin Ratio: 1.6 (ref 1.2–2.2)
Albumin: 4.5 g/dL (ref 3.8–4.8)
Alkaline Phosphatase: 87 IU/L (ref 44–121)
BUN/Creatinine Ratio: 15 (ref 12–28)
BUN: 13 mg/dL (ref 8–27)
Bilirubin Total: 0.4 mg/dL (ref 0.0–1.2)
CO2: 25 mmol/L (ref 20–29)
Calcium: 9.9 mg/dL (ref 8.7–10.3)
Chloride: 103 mmol/L (ref 96–106)
Creatinine, Ser: 0.87 mg/dL (ref 0.57–1.00)
Globulin, Total: 2.9 g/dL (ref 1.5–4.5)
Glucose: 109 mg/dL — ABNORMAL HIGH (ref 65–99)
Potassium: 5.2 mmol/L (ref 3.5–5.2)
Sodium: 141 mmol/L (ref 134–144)
Total Protein: 7.4 g/dL (ref 6.0–8.5)
eGFR: 73 mL/min/{1.73_m2} (ref >59)

## 2020-08-21 LAB — CBC
Hematocrit: 42.7 % (ref 34.0–46.6)
Hemoglobin: 14 g/dL (ref 11.1–15.9)
MCH: 29.4 pg (ref 26.6–33.0)
MCHC: 32.8 g/dL (ref 31.5–35.7)
MCV: 90 fL (ref 79–97)
Platelets: 193 10*3/uL (ref 150–450)
RBC: 4.77 x10E6/uL (ref 3.77–5.28)
RDW: 12.8 % (ref 11.7–15.4)
WBC: 3.4 10*3/uL (ref 3.4–10.8)

## 2020-08-21 LAB — LIPID PANEL
Chol/HDL Ratio: 2.9 ratio (ref 0.0–4.4)
Cholesterol, Total: 224 mg/dL — ABNORMAL HIGH (ref 100–199)
HDL: 77 mg/dL (ref >39)
LDL Chol Calc (NIH): 135 mg/dL — ABNORMAL HIGH (ref 0–99)
Triglycerides: 72 mg/dL (ref 0–149)
VLDL Cholesterol Cal: 12 mg/dL (ref 5–40)

## 2020-08-21 LAB — HEPATITIS C ANTIBODY: Hep C Virus Ab: <0.1 s/co ratio (ref 0.0–0.9)

## 2020-08-21 LAB — VITAMIN D 25 HYDROXY (VIT D DEFICIENCY, FRACTURES): Vit D, 25-Hydroxy: 54.2 ng/mL (ref 30.0–100.0)

## 2020-08-28 LAB — HM DEXA SCAN

## 2020-08-29 ENCOUNTER — Encounter: Payer: Self-pay | Admitting: Internal Medicine

## 2020-08-30 ENCOUNTER — Telehealth: Payer: Self-pay | Admitting: Medical

## 2020-08-30 NOTE — Telephone Encounter (Signed)
Your bone density study shows low bone mass or osteopenia but not osteoporosis.  I do recommend they exercise regularly including aerobic and weight bearing exercise.   Weight-bearing physical activity and exercises that improve balance and posture can strengthen bones and reduce the chance of a fracture. The more active and fit you are as you age, the less likely you are to fall and break a bone.   Good nutrition. Eat a healthy diet and make certain that you're getting 1200mg  of calcium daily in the diet from dairy (typically 4 servings) or supplements OTC.   Also they should be getting Vitamin D through eating fish, seafood, getting sun exposure, and using either OTC Vitamin D supplement such as 600 IU daily   Lets plan to repeat the bone density study in 2 years.

## 2020-09-02 NOTE — Telephone Encounter (Signed)
Pt was notified.  

## 2020-09-06 ENCOUNTER — Other Ambulatory Visit: Payer: Self-pay

## 2020-09-06 ENCOUNTER — Ambulatory Visit
Admission: RE | Admit: 2020-09-06 | Discharge: 2020-09-06 | Disposition: A | Discharge status: Home / Self Care | Payer: No Typology Code available for payment source | Source: Ambulatory Visit | Attending: Medical | Admitting: Medical

## 2020-09-06 DIAGNOSIS — Z136 Encounter for screening for cardiovascular disorders: Secondary | ICD-10-CM

## 2020-09-06 DIAGNOSIS — Z122 Encounter for screening for malignant neoplasm of respiratory organs: Secondary | ICD-10-CM

## 2020-09-06 DIAGNOSIS — Z0000 Encounter for general adult medical examination without abnormal findings: Secondary | ICD-10-CM

## 2020-09-06 DIAGNOSIS — Z87891 Personal history of nicotine dependence: Secondary | ICD-10-CM

## 2020-10-07 DIAGNOSIS — R21 Rash and other nonspecific skin eruption: Secondary | ICD-10-CM | POA: Insufficient documentation

## 2020-10-07 DIAGNOSIS — Z122 Encounter for screening for malignant neoplasm of respiratory organs: Secondary | ICD-10-CM | POA: Insufficient documentation

## 2020-10-07 DIAGNOSIS — Z87891 Personal history of nicotine dependence: Secondary | ICD-10-CM | POA: Insufficient documentation

## 2020-10-07 DIAGNOSIS — Z1159 Encounter for screening for other viral diseases: Secondary | ICD-10-CM | POA: Insufficient documentation

## 2020-10-07 DIAGNOSIS — Z13220 Encounter for screening for lipoid disorders: Secondary | ICD-10-CM | POA: Insufficient documentation

## 2021-01-02 ENCOUNTER — Telehealth: Payer: Self-pay | Admitting: Medical

## 2021-01-02 NOTE — Telephone Encounter (Signed)
Pt called and wanted to let you know that she tested positive for covid on Sunday states just feels like she has a bad cold, she just wanted to let you know that was postive.

## 2021-04-23 ENCOUNTER — Ambulatory Visit: Payer: BC Managed Care – PPO | Admitting: Medical

## 2021-04-23 VITALS — BP 130/80 | HR 86 | Temp 97.6°F | Wt 167.0 lb

## 2021-04-23 DIAGNOSIS — R059 Cough, unspecified: Secondary | ICD-10-CM

## 2021-04-23 DIAGNOSIS — J988 Other specified respiratory disorders: Secondary | ICD-10-CM

## 2021-04-23 MED ORDER — HYDROCODONE BIT-HOMATROP MBR 5-1.5 MG/5ML PO SOLN: 5.0000 mL | Freq: Three times a day (TID) | ORAL | 0 refills | Status: AC | PRN

## 2021-04-23 MED ORDER — AZITHROMYCIN 250 MG PO TABS: ORAL_TABLET | ORAL | 0 refills | Status: DC

## 2021-04-23 NOTE — Progress Notes (Signed)
Subjective: ?Chief Complaint  ?Patient presents with  ? chest congestion and cough  ?  2 weeks of chest congestion and cough and runny nose that won't go away. Cough has gotten some better.   ? ?Cough ?This is a new problem. The current episode started 1 to 4 weeks ago. The problem has been unchanged. Associated symptoms include ear congestion, a fever, headaches, rhinorrhea and a sore throat. Pertinent negatives include no chills, myalgias, sweats or wheezing. Associated symptoms comments: Initially upper respiratory but after a few days went into chest.  Started as  head cold, but now not resolving. . The treatment provided mild relief.  ?Has used mucinex 5-6 days  ? ?Past Medical History:  ?Diagnosis Date  ? Allergy   ? seasonal  ? Colon polyps   ? Dizziness 2015  ? eval with carotid dopplers, echo, brain MRI.  ? Kidney donor   ? Postmenopausal HRT (hormone replacement therapy)   ? [facility]  ? Wears glasses   ? wears bifocals  ? ?Current Outpatient Medications on File Prior to Visit  ?Medication Sig Dispense Refill  ? clobetasol ointment (TEMOVATE) 0.05 % Apply 1 application topically 2 (two) times daily. 30 g 0  ? Estradiol 0.52 MG/0.87 GM (0.06%) GEL Place 1 Squirt onto the skin daily.      ? fexofenadine (ALLEGRA) 30 MG tablet Take 30 mg by mouth 2 (two) times daily.    ? progesterone (PROMETRIUM) 100 MG capsule Take 100 mg by mouth daily.      ? ?No current facility-administered medications on file prior to visit.  ? ?ROS as in subjective ? ? ?Objective ?BP 130/80   Pulse 86   Temp 97.6 ?F (36.4 ?C)   Wt 167 lb (75.8 kg)   BMI 27.37 kg/m?  ? ?General appearance: Alert, WD/WN, no distress, somewhat ill appearing ?                            Skin: warm, no rash, no diaphoresis ?                          Head: no sinus tenderness ?                           Eyes: conjunctiva normal, corneas clear, PERRLA ?                           Ears: flat TMs, external ear canals normal ?                          Nose: septum deviated to left, turbinates swollen, with erythema but no discharge ?            Mouth/throat: MMM, tongue normal, mild pharyngeal erythema, cobblestoning ?                          Neck: supple, no adenopathy, no thyromegaly, non tender ?                         Heart: RRR, normal S1, S2, no murmurs ?                        Lungs: +bronchial breath  sounds, +scattered rhonchi, no wheezes, no rales ?               Extremities: no edema, non tender ?  ?   ? ? ?Assessment: ?Encounter Diagnoses  ?Name Primary?  ? Cough, unspecified type Yes  ? Respiratory tract infection   ? ? ? ?Plan: ?Failed therapy with OTC mucinex and other, symptoms persistent almost 2 weeks. Begin medicaiton below, add nasal saline flush and salt water gargles, good hydration.   If not resolved within 7-10 days or if worse in the coming days, call or recheck ? ? ?Grace Gates was seen today for chest congestion and cough. ? ?Diagnoses and all orders for this visit: ? ?Cough, unspecified type ? ?Respiratory tract infection ? ?Other orders ?-     azithromycin (ZITHROMAX) 250 MG tablet; 2 tablets day 1, then 1 tablet days 2-4 ?-     HYDROcodone bit-homatropine (HYCODAN) 5-1.5 MG/5ML syrup; Take 5 mLs by mouth every 8 (eight) hours as needed for up to 5 days for cough. ? ? ?F/u prn ? ? ? ? ?

## 2021-05-01 ENCOUNTER — Telehealth: Payer: Self-pay | Admitting: Medical

## 2021-05-01 ENCOUNTER — Other Ambulatory Visit: Payer: Self-pay | Admitting: Medical

## 2021-05-01 MED ORDER — ALBUTEROL SULFATE HFA 108 (90 BASE) MCG/ACT IN AERS: 2.0000 | INHALATION_SPRAY | Freq: Four times a day (QID) | RESPIRATORY_TRACT | 0 refills | Status: DC | PRN

## 2021-05-01 NOTE — Telephone Encounter (Signed)
Pt was seen 04/23/2021 for runny nose, cough and congestion. Pt states she was to call back if she thought a inhaler was needed. Pt states that she is some better but still having symptoms. She is requesting inhaler be called into [facility] on [address] and [address]. PT can be reached at [phone]. ?

## 2021-06-19 LAB — HM MAMMOGRAPHY

## 2021-06-20 ENCOUNTER — Encounter: Payer: Self-pay | Admitting: Internal Medicine

## 2021-06-20 ENCOUNTER — Encounter: Payer: Self-pay | Admitting: Medical

## 2021-07-15 ENCOUNTER — Other Ambulatory Visit: Payer: Self-pay | Admitting: Obstetrics & Gynecology

## 2021-07-15 DIAGNOSIS — Z803 Family history of malignant neoplasm of breast: Secondary | ICD-10-CM

## 2021-08-02 ENCOUNTER — Inpatient Hospital Stay: Admission: RE | Admit: 2021-08-02 | Payer: BC Managed Care – PPO | Source: Ambulatory Visit

## 2021-08-22 ENCOUNTER — Ambulatory Visit
Admission: RE | Admit: 2021-08-22 | Discharge: 2021-08-22 | Disposition: A | Discharge status: Home / Self Care | Payer: No Typology Code available for payment source | Source: Ambulatory Visit | Attending: Obstetrics & Gynecology | Admitting: Obstetrics & Gynecology

## 2021-08-22 ENCOUNTER — Encounter: Payer: Self-pay | Admitting: Medical

## 2021-08-22 ENCOUNTER — Ambulatory Visit (INDEPENDENT_AMBULATORY_CARE_PROVIDER_SITE_OTHER): Payer: BC Managed Care – PPO | Admitting: Medical

## 2021-08-22 VITALS — BP 120/70 | HR 79 | Ht 65.0 in | Wt 166.8 lb

## 2021-08-22 DIAGNOSIS — Z13220 Encounter for screening for lipoid disorders: Secondary | ICD-10-CM

## 2021-08-22 DIAGNOSIS — Z803 Family history of malignant neoplasm of breast: Secondary | ICD-10-CM

## 2021-08-22 DIAGNOSIS — M8589 Other specified disorders of bone density and structure, multiple sites: Secondary | ICD-10-CM | POA: Diagnosis not present

## 2021-08-22 DIAGNOSIS — Z905 Acquired absence of kidney: Secondary | ICD-10-CM

## 2021-08-22 DIAGNOSIS — Z524 Kidney donor: Secondary | ICD-10-CM

## 2021-08-22 DIAGNOSIS — Z0000 Encounter for general adult medical examination without abnormal findings: Secondary | ICD-10-CM

## 2021-08-22 DIAGNOSIS — Z131 Encounter for screening for diabetes mellitus: Secondary | ICD-10-CM

## 2021-08-22 LAB — POCT URINALYSIS DIP (PROADVANTAGE DEVICE)
Bilirubin, UA: NEGATIVE
Blood, UA: NEGATIVE
Glucose, UA: NEGATIVE mg/dL
Ketones, POC UA: NEGATIVE mg/dL
Leukocytes, UA: NEGATIVE
Nitrite, UA: NEGATIVE
Protein Ur, POC: NEGATIVE mg/dL
Specific Gravity, Urine: 1.01
Urobilinogen, Ur: NEGATIVE
pH, UA: 7 (ref 5.0–8.0)

## 2021-08-22 MED ORDER — GADOBUTROL 1 MMOL/ML IV SOLN
7.0000 mL | Freq: Once | INTRAVENOUS | Status: AC | PRN
  Administered 2021-08-22: 7 mL via INTRAVENOUS

## 2021-08-22 NOTE — Progress Notes (Signed)
Subjective:   HPI  Grace Gates is a 71 y.o. female who presents for Chief Complaint  Patient presents with   fasting cpe    Fasting cpe, obgyn- [facility], no concerns    Patient Care Team: [place], Cleda Mccreedy as PCP - General Sees dentist Sees eye doctor Dr. Willis Modena, GI   Concerns: Here for physical  Has right ear feels clogged up  Diet is pretty good.  Doesn't eat breakfast often, and not much junk food  Reviewed their medical, surgical, family, social, medication, and allergy history and updated chart as appropriate.  Past Medical History:  Diagnosis Date   Allergy    seasonal   Colon polyps    Dizziness 2015   eval with carotid dopplers, echo, brain MRI.   Kidney donor    Postmenopausal HRT (hormone replacement therapy)    [facility]   Wears glasses    wears bifocals    Family History  Problem Relation Age of Onset   Alcohol abuse Mother    Cirrhosis Mother        died of cirrhosis   Cancer Father        died of rare bone cancer   Cancer Sister        endometrial cancer   Cancer Sister        breast cancer, ovarian cancer   Heart disease Paternal Uncle        died of MI   Stroke Neg Hx    Hypertension Neg Hx    Diabetes Neg Hx      Current Outpatient Medications:    albuterol (VENTOLIN HFA) 108 (90 Base) MCG/ACT inhaler, Inhale 2 puffs into the lungs every 6 (six) hours as needed for wheezing or shortness of breath., Disp: 18 g, Rfl: 0   clobetasol ointment (TEMOVATE) 0.05 %, Apply 1 application topically 2 (two) times daily., Disp: 30 g, Rfl: 0   fexofenadine (ALLEGRA) 30 MG tablet, Take 30 mg by mouth 2 (two) times daily., Disp: , Rfl:   Allergies  Allergen Reactions   Adhesive [Tape] Rash     Review of Systems Constitutional: -fever, -chills, -sweats, -unexpected weight change, -decreased appetite, -fatigue Allergy: -sneezing, -itching, -congestion Dermatology: -changing moles, --rash, -lumps ENT: -runny nose, -ear  pain, -sore throat, -hoarseness, -sinus pain, -teeth pain, - ringing in ears, -hearing loss, -nosebleeds Cardiology: -chest pain, -palpitations, -swelling, -difficulty breathing when lying flat, -waking up short of breath Respiratory: -cough, -shortness of breath, -difficulty breathing with exercise or exertion, -wheezing, -coughing up blood Gastroenterology: -abdominal pain, -nausea, -vomiting, -diarrhea, -constipation, -blood in stool, -changes in bowel movement, -difficulty swallowing or eating Hematology: -bleeding, -bruising  Musculoskeletal: -joint aches, -muscle aches, -joint swelling, -back pain, -neck pain, -cramping, -changes in gait Ophthalmology: denies vision changes, eye redness, itching, discharge Urology: -burning with urination, -difficulty urinating, -blood in urine, -urinary frequency, -urgency, -incontinence Neurology: -headache, -weakness, -tingling, -numbness, -memory loss, -falls, -dizziness Psychology: -depressed mood, -agitation, -sleep problems Breast/gyn: -breast tendnerss, -discharge, -lumps, -vaginal discharge,- irregular periods, -heavy periods      08/22/2021    8:22 AM 04/23/2021   11:39 AM 08/20/2020   10:06 AM 02/17/2018    8:34 AM 02/25/2016    8:40 AM  Depression screen PHQ 2/9  Decreased Interest 0 0 0 0 0  Down, Depressed, Hopeless 0 0 0 0 0  PHQ - 2 Score 0 0 0 0 0       Objective:  BP 120/70   Pulse 79  Ht 5\' 5"  (1.651 m)   Wt 166 lb 12.8 oz (75.7 kg)   BMI 27.76 kg/m   General appearance: alert, no distress, WD/WN, Caucasian female Skin: unremarkable HEENT: normocephalic, conjunctiva/corneas normal, sclerae anicteric, PERRLA, EOMi, right ear canal with impacted cerumen, left TM and canal normal, nares patent, no discharge or erythema, pharynx normal Oral cavity: MMM, tongue normal, teeth normal Neck: supple, no lymphadenopathy, no thyromegaly, no masses, normal ROM, no bruits Chest: non tender, normal shape and expansion Heart: RRR, normal  S1, S2, no murmurs Lungs: CTA bilaterally, no wheezes, rhonchi, or rales Abdomen: +bs, soft, multiple surgical scars, non tender, non distended, no masses, no hepatomegaly, no splenomegaly, no bruits Back: non tender, normal ROM, no scoliosis Musculoskeletal: upper extremities non tender, no obvious deformity, normal ROM throughout, lower extremities non tender, no obvious deformity, normal ROM throughout Extremities: no edema, no cyanosis, no clubbing Pulses: 2+ symmetric, upper and lower extremities, normal cap refill Neurological: alert, oriented x 3, CN2-12 intact, strength normal upper extremities and lower extremities, sensation normal throughout, DTRs 2+ throughout, no cerebellar signs, gait normal Psychiatric: normal affect, behavior normal, pleasant  Breast/gyn/rectal - deferred to gynecology     Assessment and Plan :   Encounter Diagnoses  Name Primary?   Encounter for health maintenance examination in adult Yes   Single kidney    History of kidney donation    Kidney donor    Osteopenia of multiple sites    Screening for lipid disorders    Screening for diabetes mellitus      This visit was a preventative care visit, also known as wellness visit or routine physical.   Topics typically include healthy lifestyle, diet, exercise, preventative care, vaccinations, sick and well care, proper use of emergency dept and after hours care, as well as other concerns.     Recommendations: Continue to return yearly for your annual wellness and preventative care visits.  This gives a chance to discuss healthy lifestyle, exercise, vaccinations, review your chart record, and perform screenings where appropriate.  I recommend you see your eye doctor yearly for routine vision care.  I recommend you see your dentist yearly for routine dental care including hygiene visits twice yearly.   Vaccination recommendations were reviewed Immunization History  Administered Date(s) Administered    Fluad Quad(high Dose 65+) 08/26/2018   Influenza, High Dose Seasonal PF 10/07/2017   Moderna Sars-Covid-2 Vaccination 02/10/2019, 03/11/2019, 10/18/2019   Pneumococcal Conjugate-13 02/17/2018   Tdap 08/20/2020   Zoster Recombinat (Shingrix) 02/24/2018, 04/29/2018    Advised yearly flu vaccine   Screening for cancer: Colon cancer screening: I reviewed your colonoscopy on file that is up to date from 2018, has appt for updated consult next month  Breast cancer screening: You should perform a self breast exam monthly.   We reviewed recommendations for regular mammograms and breast cancer screening.  Cervical cancer screening: We reviewed recommendations for pap smear screening.   Skin cancer screening: Check your skin regularly for new changes, growing lesions, or other lesions of concern Come in for evaluation if you have skin lesions of concern.  Lung cancer screening: If you have a greater than 20 pack year history of tobacco use, then you may qualify for lung cancer screening with a chest CT scan.   Please call your insurance company to inquire about coverage for this test.  We currently don't have screenings for other cancers besides breast, cervical, colon, and lung cancers.  If you have a strong family history  of cancer or have other cancer screening concerns, please let me know.    Bone health: Get at least 150 minutes of aerobic exercise weekly Get weight bearing exercise at least once weekly Bone density test:  A bone density test is an imaging test that uses a type of X-ray to measure the amount of calcium and other minerals in your bones. The test may be used to diagnose or screen you for a condition that causes weak or thin bones (osteoporosis), predict your risk for a broken bone (fracture), or determine how well your osteoporosis treatment is working. The bone density test is recommended for females 65 and older, or females or males <65 if certain risk factors  such as thyroid disease, long term use of steroids such as for asthma or rheumatological issues, vitamin D deficiency, estrogen deficiency, family history of osteoporosis, self or family history of fragility fracture in first degree relative.  Reviewed 2022 bone density test showing osteopenia.   Heart health: Get at least 150 minutes of aerobic exercise weekly Limit alcohol It is important to maintain a healthy blood pressure and healthy cholesterol numbers  Heart disease screening: Screening for heart disease includes screening for blood pressure, fasting lipids, glucose/diabetes screening, BMI height to weight ratio, reviewed of smoking status, physical activity, and diet.    Goals include blood pressure 120/80 or less, maintaining a healthy lipid/cholesterol profile, preventing diabetes or keeping diabetes numbers under good control, not smoking or using tobacco products, exercising most days per week or at least 150 minutes per week of exercise, and eating healthy variety of fruits and vegetables, healthy oils, and avoiding unhealthy food choices like fried food, fast food, high sugar and high cholesterol foods.    Other tests may possibly include EKG test, CT coronary calcium score, echocardiogram, exercise treadmill stress test.    Medical care options: I recommend you continue to seek care here first for routine care.  We try really hard to have available appointments Monday through Friday daytime hours for sick visits, acute visits, and physicals.  Urgent care should be used for after hours and weekends for significant issues that cannot wait till the next day.  The emergency department should be used for significant potentially life-threatening emergencies.  The emergency department is expensive, can often have long wait times for less significant concerns, so try to utilize primary care, urgent care, or telemedicine when possible to avoid unnecessary trips to the emergency department.   Virtual visits and telemedicine have been introduced since the pandemic started in 2020, and can be convenient ways to receive medical care.  We offer virtual appointments as well to assist you in a variety of options to seek medical care.   Advanced Directives: I recommend you consider completing a Health Care Power of Attorney and Living Will.   These documents respect your wishes and help alleviate burdens on your loved ones if you were to become terminally ill or be in a position to need those documents enforced.    You can complete Advanced Directives yourself, have them notarized, then have copies made for our office, for you and for anybody you feel should have them in safe keeping.  Or, you can have an attorney prepare these documents.   If you haven't updated your Last Will and Testament in a while, it may be worthwhile having an attorney prepare these documents together and save on some costs.       Separate significant issues discussed: Impaired glucose - labs  updated today  Counseled on low sugar health diet and exercise  Discussed findings.  Discussed risk/benefits of procedure and patient agrees to procedure. Successfully used warm water lavage to remove impacted cerumen from right ear canal. Patient tolerated procedure well. Advised they avoid using any cotton swabs or other devices to clean the ear canals.  Use basic hygiene as discussed.  Follow up prn.     Mayling was seen today for fasting cpe.  Diagnoses and all orders for this visit:  Encounter for health maintenance examination in adult -     Comprehensive metabolic panel -     CBC -     Lipid panel -     Urinalysis, Routine w reflex microscopic -     Hemoglobin A1c  Single kidney  History of kidney donation  Kidney donor  Osteopenia of multiple sites  Screening for lipid disorders -     Lipid panel  Screening for diabetes mellitus -     Hemoglobin A1c    Follow-up pending labs, yearly for  physical

## 2021-08-23 LAB — COMPREHENSIVE METABOLIC PANEL
ALT: 13 IU/L (ref 0–32)
AST: 16 IU/L (ref 0–40)
Albumin/Globulin Ratio: 1.5 (ref 1.2–2.2)
Albumin: 4.2 g/dL (ref 3.9–4.9)
Alkaline Phosphatase: 91 IU/L (ref 44–121)
BUN/Creatinine Ratio: 14 (ref 12–28)
BUN: 13 mg/dL (ref 8–27)
Bilirubin Total: 0.4 mg/dL (ref 0.0–1.2)
CO2: 25 mmol/L (ref 20–29)
Calcium: 9.8 mg/dL (ref 8.7–10.3)
Chloride: 102 mmol/L (ref 96–106)
Creatinine, Ser: 0.95 mg/dL (ref 0.57–1.00)
Globulin, Total: 2.8 g/dL (ref 1.5–4.5)
Glucose: 99 mg/dL (ref 70–99)
Potassium: 4.8 mmol/L (ref 3.5–5.2)
Sodium: 140 mmol/L (ref 134–144)
Total Protein: 7 g/dL (ref 6.0–8.5)
eGFR: 65 mL/min/{1.73_m2} (ref >59)

## 2021-08-23 LAB — CBC
Hematocrit: 43.3 % (ref 34.0–46.6)
Hemoglobin: 14.2 g/dL (ref 11.1–15.9)
MCH: 29.2 pg (ref 26.6–33.0)
MCHC: 32.8 g/dL (ref 31.5–35.7)
MCV: 89 fL (ref 79–97)
Platelets: 189 10*3/uL (ref 150–450)
RBC: 4.87 x10E6/uL (ref 3.77–5.28)
RDW: 12.4 % (ref 11.7–15.4)
WBC: 3.9 10*3/uL (ref 3.4–10.8)

## 2021-08-23 LAB — LIPID PANEL
Chol/HDL Ratio: 2.9 ratio (ref 0.0–4.4)
Cholesterol, Total: 212 mg/dL — ABNORMAL HIGH (ref 100–199)
HDL: 73 mg/dL (ref >39)
LDL Chol Calc (NIH): 126 mg/dL — ABNORMAL HIGH (ref 0–99)
Triglycerides: 72 mg/dL (ref 0–149)
VLDL Cholesterol Cal: 13 mg/dL (ref 5–40)

## 2021-08-23 LAB — HEMOGLOBIN A1C
Est. average glucose Bld gHb Est-mCnc: 111 mg/dL
Hgb A1c MFr Bld: 5.5 % (ref 4.8–5.6)

## 2021-09-10 ENCOUNTER — Encounter: Payer: Self-pay | Admitting: Internal Medicine

## 2021-10-14 ENCOUNTER — Encounter: Payer: Self-pay | Admitting: Internal Medicine

## 2021-11-05 LAB — HM COLONOSCOPY

## 2022-01-14 ENCOUNTER — Encounter: Payer: Self-pay | Admitting: Internal Medicine

## 2022-06-09 IMAGING — CT CT CARDIAC CORONARY ARTERY CALCIUM SCORE
3 series · 14 of 20 positions shown, 16 images · non-contrast
Comparison: None.

CLINICAL DATA: 69-year-old Caucasian female with prior smoking
history.

EXAM:
CT CARDIAC CORONARY ARTERY CALCIUM SCORE
TECHNIQUE: Non-contrast imaging through the heart was performed using
prospective ECG gating. Image post processing was performed on an
independent workstation, allowing for quantitative analysis of the
heart and coronary arteries. Note that this exam targets the heart
and the chest was not imaged in its entirety.

[Series 2: calcium scoring 2.00 qr36 bestdiast 71% hrt calciu · axial · 0.30mm/px · z∈[+1732,+1808]mm · 4 of 64 slices shown]
[im 13/64  vessel]
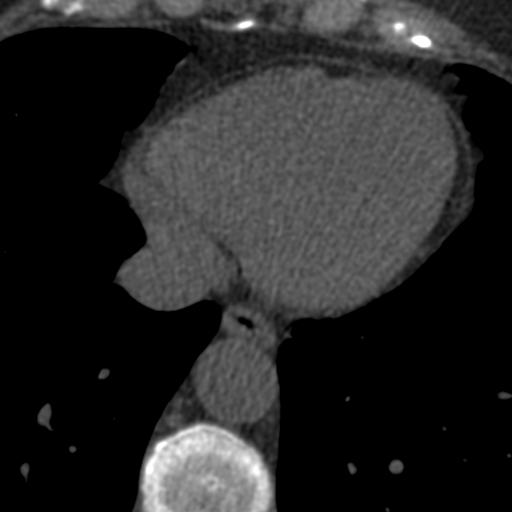
[im 26/64  vessel]
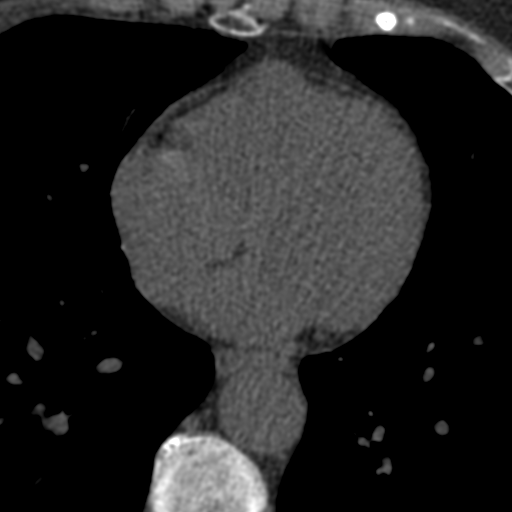
[im 38/64  vessel]
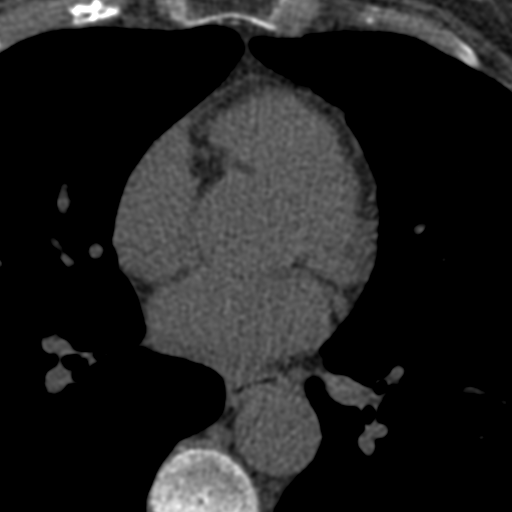
[im 51/64  vessel]
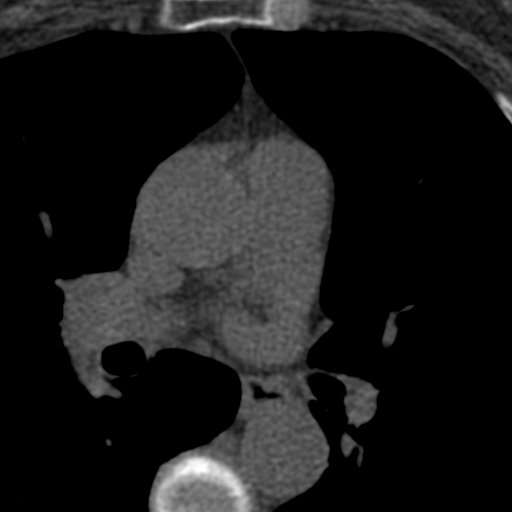

[Series 3: calcium scoring 2.00 br40 bestdiast 71% axial · axial · 0.62mm/px · z∈[+1728,+1812]mm · 5 of 64 slices shown, 7 images]
[im 11/64  vessel]
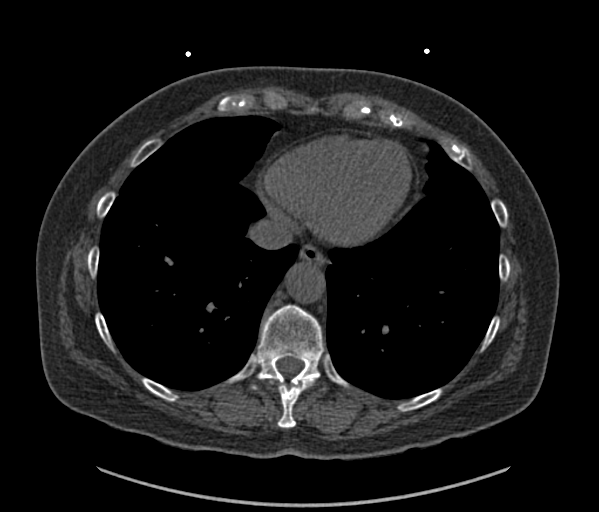
[im 11/64  lung]
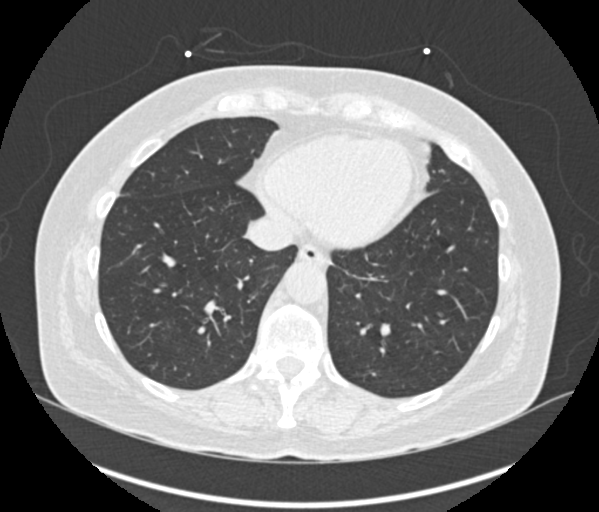
[im 22/64  vessel]
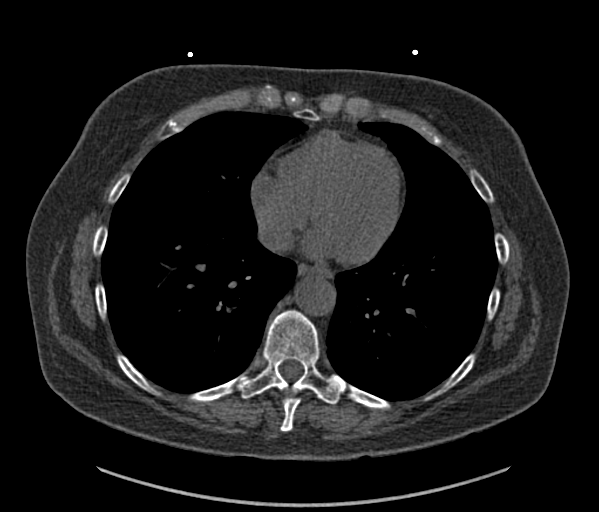
[im 32/64  vessel]
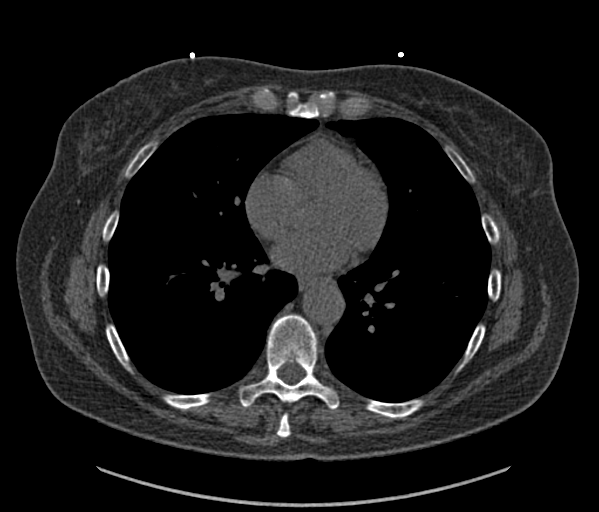
[im 43/64  vessel]
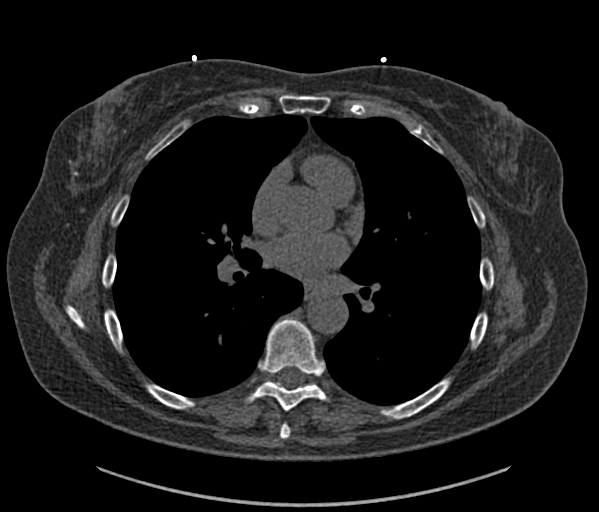
[im 53/64  vessel]
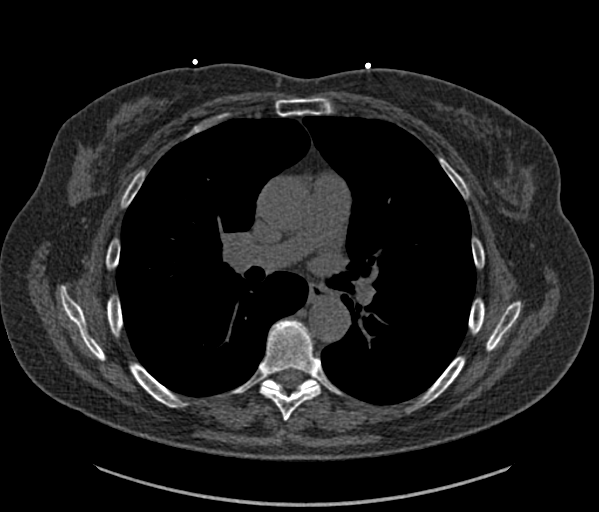
[im 53/64  lung]
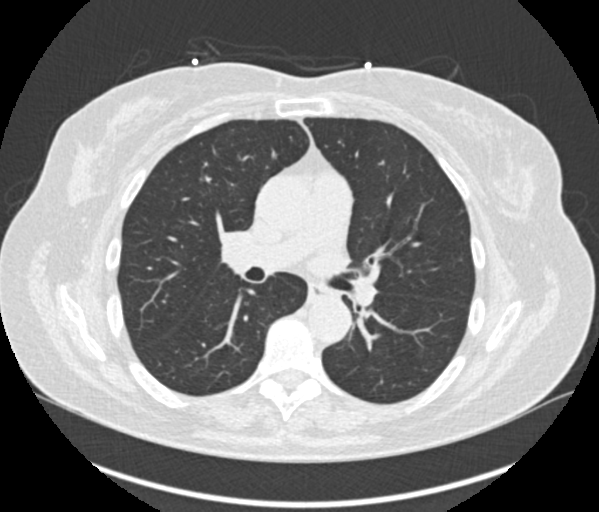

[Series 9: calcium scoring 2.00 br60 bestdiast 71% lungs · axial · 0.62mm/px · z∈[+1728,+1812]mm · 5 of 64 slices shown]
[im 11/64  vessel]
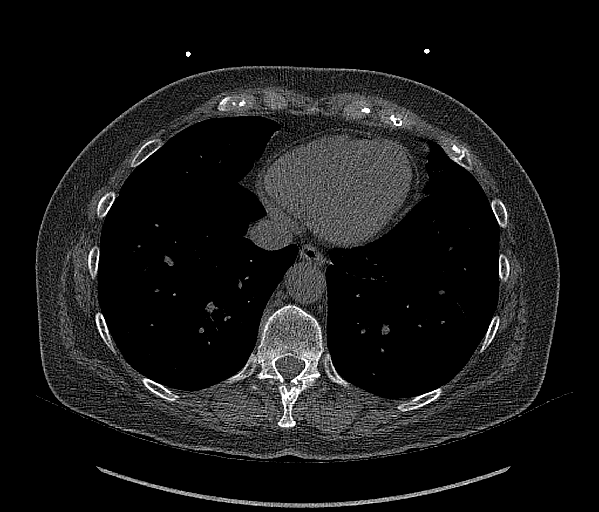
[im 22/64  vessel]
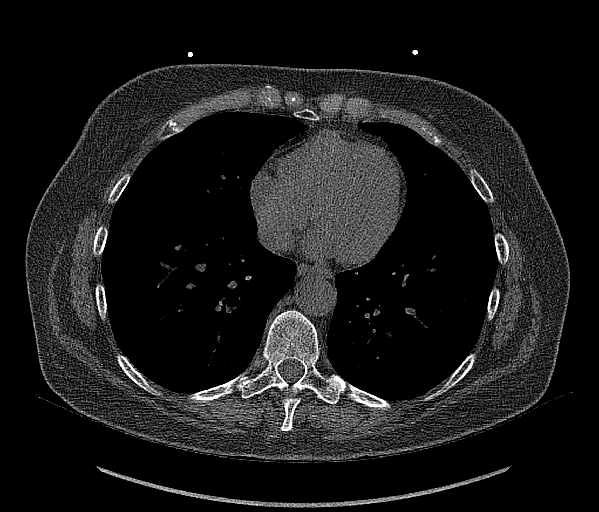
[im 32/64  vessel]
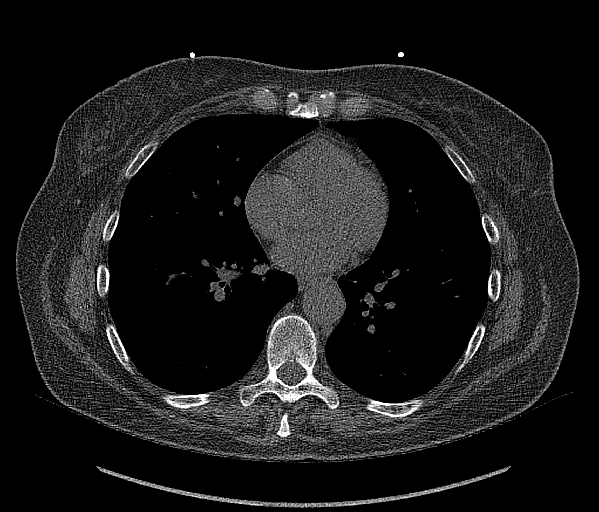
[im 43/64  vessel]
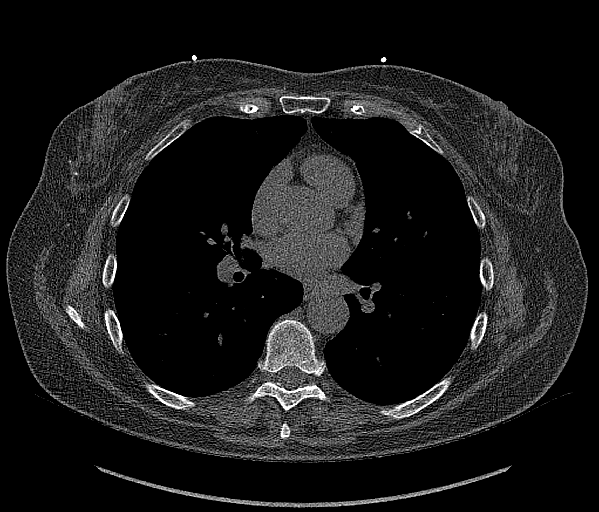
[im 53/64  vessel]
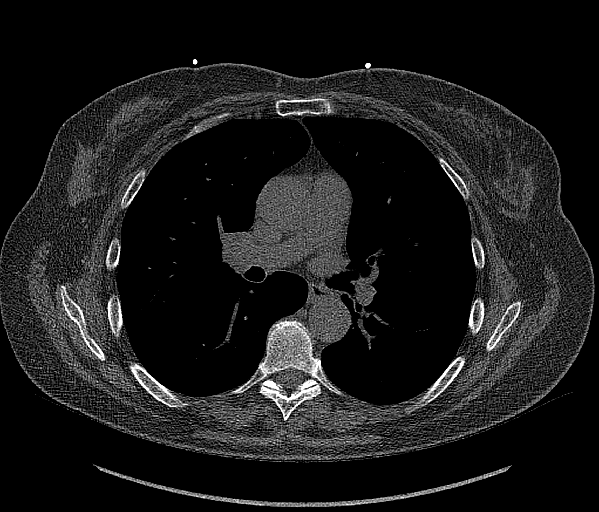

[14 of 20 positions shown; findings below may reference images not displayed]

FINDINGS: CORONARY CALCIUM SCORES:

Left Main: 0

LAD: 0

LCx: 0

RCA: 0

Total Agatston Score: 0

[HOSPITAL] percentile: 0

AORTA MEASUREMENTS:

Ascending Aorta: 29 mm

Descending Aorta: 25 mm

OTHER FINDINGS:


## 2022-06-25 ENCOUNTER — Encounter: Payer: Self-pay | Admitting: Family Medicine

## 2022-06-25 ENCOUNTER — Ambulatory Visit: Payer: BC Managed Care – PPO | Admitting: Family Medicine

## 2022-06-25 VITALS — BP 122/82 | HR 65 | Temp 98.1°F | Wt 165.4 lb

## 2022-06-25 DIAGNOSIS — R21 Rash and other nonspecific skin eruption: Secondary | ICD-10-CM | POA: Diagnosis not present

## 2022-06-25 DIAGNOSIS — W57XXXA Bitten or stung by nonvenomous insect and other nonvenomous arthropods, initial encounter: Secondary | ICD-10-CM | POA: Diagnosis not present

## 2022-06-25 DIAGNOSIS — L03115 Cellulitis of right lower limb: Secondary | ICD-10-CM | POA: Diagnosis not present

## 2022-06-25 DIAGNOSIS — S90561A Insect bite (nonvenomous), right ankle, initial encounter: Secondary | ICD-10-CM | POA: Diagnosis not present

## 2022-06-25 LAB — HM MAMMOGRAPHY

## 2022-06-25 MED ORDER — SULFAMETHOXAZOLE-TRIMETHOPRIM 800-160 MG PO TABS
1.0000 | ORAL_TABLET | Freq: Two times a day (BID) | ORAL | 0 refills | Status: DC
Start: 2022-06-25 — End: 2022-08-27

## 2022-06-25 MED ORDER — CLOBETASOL PROPIONATE 0.05 % EX OINT: 1.0000 | TOPICAL_OINTMENT | Freq: Two times a day (BID) | CUTANEOUS | 0 refills | Status: DC

## 2022-06-25 NOTE — Patient Instructions (Addendum)
Stop taking the keflex Switch to Septra DS, take it twice daily. Keep an eye on the marked area--seek re-evaluation if the redness continues to spread beyond this area, or if you see streaks up.  Take Allegra (vs other once daily, non-sedating antihistamine) and you can continue the Zquil at night. Ice and elevate the area when it is swollen, or as needed for pain.  Return next week if not improving, seek more immediate care if clearly worsening (fever, streaks, vomiting, increased redness).  Use the new cream sparingly (it is likely stronger than the one that you have been using, which was old). Apply it only to the affected area of skin, twice daily, for no longer than 2 weeks. Your current rash isn't bad enough for this prescription cream. You can use over-the-counter hydrocortisone creams for these milder areas, and the prescription for the big flares.

## 2022-06-25 NOTE — Progress Notes (Signed)
Chief Complaint  Patient presents with   other    Spider bite, about 2 weeks ago, the itch was very intense, no itching now though, on rt. Ankle has a huge spot and is swollen, since having spider bite very fatigued, had nausea, and diarrhea once, the spot is bigger now than before, and yesterday it hurt and it had not hurt before,    6/4 or 6/5 she noticed itching at her right lateral ankle.  She scratched at it, used a "bug bite" benadryl pen.  The itch was intense. Never felt actual bite. Denies any tick bite/attachment, never saw a spider. The itching resolved after 3-4 days. She felt exhausted the week of 6/10. On 6/14 she went to [employer], walking on the treadmill. Something didn't feel right with her sneaker, when she got home, it looked very red. It has continued to get larger and more painful since.  She went to [facility] on Sunday 6/16 morning--prescribed keflex. Increased in size between Sunday and Wednesday, with increased pain and swelling noted yesterday. She had fever Monday night--subjective, along with chills.  The next morning her temp was 100.5. No fever that she is aware of since Tuesday morning. Less painful today--able to walk without limping Swelling is better today than yesterday.   Has "psoriasis" breakouts, requesting refill on clobetasol.  Flaring some on R elbow recently, improving now. Would like RF to have on hand for subsequent flares.  PMH, PSH, SH reviewed  Outpatient Encounter Medications as of 06/25/2022  Medication Sig Note   cephALEXin (KEFLEX) 500 MG capsule Take 500 mg by mouth 4 (four) times daily.    clobetasol ointment (TEMOVATE) 0.05 % Apply 1 application topically 2 (two) times daily.    albuterol (VENTOLIN HFA) 108 (90 Base) MCG/ACT inhaler Inhale 2 puffs into the lungs every 6 (six) hours as needed for wheezing or shortness of breath. (Patient not taking: Reported on 06/25/2022)    fexofenadine (ALLEGRA) 30 MG tablet Take 30 mg by mouth 2 (two)  times daily. (Patient not taking: Reported on 06/25/2022) 06/25/2022: Uses prn   No facility-administered encounter medications on file as of 06/25/2022.   Allergies  Allergen Reactions   Adhesive [Tape] Rash   ROS:  fever/chills per HPI. She had nausea Monday only, and once the week before. No diarrhea, abdominal pain. No URI symptoms, chest, pain, shortness of breath. See HPI   PHYSICAL EXAM:  BP 122/82   Pulse 65   Wt 165 lb 6.4 oz (75 kg)   BMI 27.52 kg/m  Temp 98.1  Well-appearing female in no distress Right lateral ankle--7x7 cm beefy red area with central scab. No fluctuance, crusting, drainage. No streaks. Slightly puffy and more tender along the medial aspect of the erythematous area. 2+ pulses, normal sensation.  R forearm at elbow--mild erythema, no plaque ("calm now" per pt)   ASSESSMENT/PLAN:  Insect bite of right ankle, initial encounter - Itching has subsided, now with more swelling and discomfort. Treat for local reaction with antihistamine, icing prn. No necrosis or target.  Cellulitis of right lower extremity - fever and increasing redness/pain despite use of Keflex. Change to Septra.  Ice/elevate. f/u if increase in size, redness, fever, streaks, changes/new sx - Plan: sulfamethoxazole-trimethoprim (BACTRIM DS) 800-160 MG tablet  Rash - intermittent rashes, poss c/w psoriasis.  Mild now--advised NOT to use strong steroid cream uniess worse/thickener plaques - Plan: clobetasol ointment (TEMOVATE) 0.05 %  Stop taking the keflex Switch to Septra DS, take it twice daily.  Keep an eye on the marked area--seek re-evaluation if the redness continues to spread beyond this area, or if you see streaks up.  Take Allegra (vs other once daily, non-sedating antihistamine) and you can continue the Zquil at night. Ice and elevate the area when it is swollen, or as needed for pain.  Return next week if not improving, seek more immediate care if clearly worsening (fever,  streaks, vomiting, increased redness).  Use the new cream sparingly (it is likely stronger than the one that you have been using, which was old). Apply it only to the affected area of skin, twice daily, for no longer than 2 weeks. Your current rash isn't bad enough for this prescription cream. You can use over-the-counter hydrocortisone creams for these milder areas, and the prescription for the big flares.

## 2022-08-27 ENCOUNTER — Ambulatory Visit (INDEPENDENT_AMBULATORY_CARE_PROVIDER_SITE_OTHER): Payer: BC Managed Care – PPO | Admitting: Medical

## 2022-08-27 ENCOUNTER — Encounter: Payer: Self-pay | Admitting: Medical

## 2022-08-27 VITALS — BP 120/72 | HR 59 | Ht 65.5 in | Wt 166.8 lb

## 2022-08-27 DIAGNOSIS — Z0000 Encounter for general adult medical examination without abnormal findings: Secondary | ICD-10-CM | POA: Diagnosis not present

## 2022-08-27 DIAGNOSIS — Z524 Kidney donor: Secondary | ICD-10-CM

## 2022-08-27 DIAGNOSIS — E2839 Other primary ovarian failure: Secondary | ICD-10-CM

## 2022-08-27 DIAGNOSIS — Z7185 Encounter for immunization safety counseling: Secondary | ICD-10-CM

## 2022-08-27 DIAGNOSIS — Z13220 Encounter for screening for lipoid disorders: Secondary | ICD-10-CM

## 2022-08-27 DIAGNOSIS — Z122 Encounter for screening for malignant neoplasm of respiratory organs: Secondary | ICD-10-CM

## 2022-08-27 DIAGNOSIS — Z23 Encounter for immunization: Secondary | ICD-10-CM

## 2022-08-27 DIAGNOSIS — Z905 Acquired absence of kidney: Secondary | ICD-10-CM

## 2022-08-27 DIAGNOSIS — M8589 Other specified disorders of bone density and structure, multiple sites: Secondary | ICD-10-CM

## 2022-08-27 DIAGNOSIS — Z780 Asymptomatic menopausal state: Secondary | ICD-10-CM

## 2022-08-27 LAB — POCT URINALYSIS DIP (PROADVANTAGE DEVICE)
Bilirubin, UA: NEGATIVE
Blood, UA: NEGATIVE
Glucose, UA: NEGATIVE mg/dL
Ketones, POC UA: NEGATIVE mg/dL
Leukocytes, UA: NEGATIVE
Nitrite, UA: NEGATIVE
Protein Ur, POC: NEGATIVE mg/dL
Specific Gravity, Urine: 1.02
Urobilinogen, Ur: NEGATIVE
pH, UA: 7 (ref 5.0–8.0)

## 2022-08-27 NOTE — Progress Notes (Signed)
Subjective:   HPI  Grace Gates is a 72 y.o. female who presents for Chief Complaint  Patient presents with   Annual Exam    Fasting cpe, no concerns    Patient Care Team: Orie Baxendale, Cleda Mccreedy as PCP - General Sees dentist Sees eye doctor Dr. Willis Modena, GI   Concerns: Here for physical  Doing fine, no issues.  Reviewed their medical, surgical, family, social, medication, and allergy history and updated chart as appropriate.  Past Medical History:  Diagnosis Date   Allergy    seasonal   Colon polyps    Dizziness 2015   eval with carotid dopplers, echo, brain MRI.   Kidney donor    Postmenopausal HRT (hormone replacement therapy)    [facility]   Wears glasses    wears bifocals    Family History  Problem Relation Age of Onset   Alcohol abuse Mother    Cirrhosis Mother        died of cirrhosis   Cancer Father        died of rare bone cancer   Cancer Sister        endometrial cancer   Cancer Sister        breast cancer, ovarian cancer   Heart disease Paternal Uncle        died of MI   Stroke Neg Hx    Hypertension Neg Hx    Diabetes Neg Hx      Current Outpatient Medications:    fexofenadine (ALLEGRA) 30 MG tablet, Take 30 mg by mouth 2 (two) times daily., Disp: , Rfl:   Allergies  Allergen Reactions   Adhesive [Tape] Rash   Review of Systems  Constitutional:  Negative for chills, fever, malaise/fatigue and weight loss.  HENT:  Negative for congestion, ear pain, hearing loss, sore throat and tinnitus.   Eyes:  Negative for blurred vision, pain and redness.  Respiratory:  Negative for cough, hemoptysis and shortness of breath.   Cardiovascular:  Negative for chest pain, palpitations, orthopnea, claudication and leg swelling.  Gastrointestinal:  Negative for abdominal pain, blood in stool, constipation, diarrhea, nausea and vomiting.  Genitourinary:  Negative for dysuria, flank pain, frequency, hematuria and urgency.  Musculoskeletal:   Negative for falls, joint pain and myalgias.  Skin:  Negative for itching and rash.  Neurological:  Negative for dizziness, tingling, speech change, weakness and headaches.  Endo/Heme/Allergies:  Negative for polydipsia. Does not bruise/bleed easily.  Psychiatric/Behavioral:  Negative for depression and memory loss. The patient is not nervous/anxious and does not have insomnia.          08/27/2022    8:23 AM 08/22/2021    8:22 AM 04/23/2021   11:39 AM 08/20/2020   10:06 AM 02/17/2018    8:34 AM  Depression screen PHQ 2/9  Decreased Interest 0 0 0 0 0  Down, Depressed, Hopeless 0 0 0 0 0  PHQ - 2 Score 0 0 0 0 0       Objective:  BP 120/72   Pulse (!) 59   Ht 5' 5.5" (1.664 m)   Wt 166 lb 12.8 oz (75.7 kg)   BMI 27.33 kg/m   General appearance: alert, no distress, WD/WN, Caucasian female Skin: unremarkable HEENT: normocephalic, conjunctiva/corneas normal, sclerae anicteric, PERRLA, EOMi, no discharge or erythema, pharynx normal Oral cavity: MMM, tongue normal, teeth normal Neck: supple, no lymphadenopathy, no thyromegaly, no masses, normal ROM, no bruits Chest: non tender, normal shape and expansion Heart: RRR, normal  S1, S2, no murmurs Lungs: CTA bilaterally, no wheezes, rhonchi, or rales Abdomen: +bs, soft, multiple surgical scars, non tender, non distended, no masses, no hepatomegaly, no splenomegaly, no bruits Back: non tender, normal ROM, no scoliosis Musculoskeletal: upper extremities non tender, no obvious deformity, normal ROM throughout, lower extremities non tender, no obvious deformity, normal ROM throughout Extremities: no edema, no cyanosis, no clubbing Pulses: 2+ symmetric, upper and lower extremities, normal cap refill Neurological: alert, oriented x 3, CN2-12 intact, strength normal upper extremities and lower extremities, sensation normal throughout, DTRs 2+ throughout, no cerebellar signs, gait normal Psychiatric: normal affect, behavior normal, pleasant   Breast/gyn/rectal - deferred to gynecology     Assessment and Plan :   Encounter Diagnoses  Name Primary?   Encounter for health maintenance examination in adult Yes   Vaccine counseling    Single kidney    Screening for lung cancer    Screening for lipid disorders    Post-menopausal    Osteopenia of multiple sites    Kidney donor    History of kidney donation    Estrogen deficiency    Need for pneumococcal vaccination      This visit was a preventative care visit, also known as wellness visit or routine physical.   Topics typically include healthy lifestyle, diet, exercise, preventative care, vaccinations, sick and well care, proper use of emergency dept and after hours care, as well as other concerns.     Recommendations: Continue to return yearly for your annual wellness and preventative care visits.  This gives [place] a chance to discuss healthy lifestyle, exercise, vaccinations, review your chart record, and perform screenings where appropriate.  I recommend you see your eye doctor yearly for routine vision care.  I recommend you see your dentist yearly for routine dental care including hygiene visits twice yearly.   Vaccination recommendations were reviewed Immunization History  Administered Date(s) Administered   COVID-19, mRNA, vaccine(Comirnaty)12 years and older 10/05/2021   Fluad Quad(high Dose 65+) 08/26/2018   Influenza, High Dose Seasonal PF 10/07/2017   Influenza-Unspecified 10/05/2021   Moderna Sars-Covid-2 Vaccination 02/10/2019, 03/11/2019, 10/18/2019   PNEUMOCOCCAL CONJUGATE-20 08/27/2022   Pneumococcal Conjugate-13 02/17/2018   Tdap 08/20/2020   Zoster Recombinant(Shingrix) 02/24/2018, 04/29/2018    Advised yearly flu vaccine  Counseled on the pneumococcal vaccine.  Vaccine information sheet given.  Pneumococcal vaccine Prevnar 20 given after consent obtained.   Screening for cancer: Colon cancer screening: I reviewed your colonoscopy on file  that is up to date from 2018, has appt for updated consult next month We will request your 2023 colonoscopy report  Breast cancer screening: You should perform a self breast exam monthly.   We reviewed recommendations for regular mammograms and breast cancer screening.  Cervical cancer screening: We reviewed recommendations for pap smear screening.   Skin cancer screening: Check your skin regularly for new changes, growing lesions, or other lesions of concern Come in for evaluation if you have skin lesions of concern.  Lung cancer screening: If you have a greater than 20 pack year history of tobacco use, then you may qualify for lung cancer screening with a chest CT scan.   Please call your insurance company to inquire about coverage for this test.  We currently don't have screenings for other cancers besides breast, cervical, colon, and lung cancers.  If you have a strong family history of cancer or have other cancer screening concerns, please let me know.    Bone health: Get at least 150 minutes  of aerobic exercise weekly Get weight bearing exercise at least once weekly Bone density test:  A bone density test is an imaging test that uses a type of X-ray to measure the amount of calcium and other minerals in your bones. The test may be used to diagnose or screen you for a condition that causes weak or thin bones (osteoporosis), predict your risk for a broken bone (fracture), or determine how well your osteoporosis treatment is working. The bone density test is recommended for females 65 and older, or females or males <65 if certain risk factors such as thyroid disease, long term use of steroids such as for asthma or rheumatological issues, vitamin D deficiency, estrogen deficiency, family history of osteoporosis, self or family history of fragility fracture in first degree relative.  Reviewed 2022 bone density test showing osteopenia.   Heart health: Get at least 150 minutes of  aerobic exercise weekly Limit alcohol It is important to maintain a healthy blood pressure and healthy cholesterol numbers  Heart disease screening: Screening for heart disease includes screening for blood pressure, fasting lipids, glucose/diabetes screening, BMI height to weight ratio, reviewed of smoking status, physical activity, and diet.    Goals include blood pressure 120/80 or less, maintaining a healthy lipid/cholesterol profile, preventing diabetes or keeping diabetes numbers under good control, not smoking or using tobacco products, exercising most days per week or at least 150 minutes per week of exercise, and eating healthy variety of fruits and vegetables, healthy oils, and avoiding unhealthy food choices like fried food, fast food, high sugar and high cholesterol foods.    Other tests may possibly include EKG test, CT coronary calcium score, echocardiogram, exercise treadmill stress test.    Medical care options: I recommend you continue to seek care here first for routine care.  We try really hard to have available appointments Monday through Friday daytime hours for sick visits, acute visits, and physicals.  Urgent care should be used for after hours and weekends for significant issues that cannot wait till the next day.  The emergency department should be used for significant potentially life-threatening emergencies.  The emergency department is expensive, can often have long wait times for less significant concerns, so try to utilize primary care, urgent care, or telemedicine when possible to avoid unnecessary trips to the emergency department.  Virtual visits and telemedicine have been introduced since the pandemic started in 2020, and can be convenient ways to receive medical care.  We offer virtual appointments as well to assist you in a variety of options to seek medical care.   Advanced Directives: I recommend you consider completing a Health Care Power of Attorney and Living  Will.   These documents respect your wishes and help alleviate burdens on your loved ones if you were to become terminally ill or be in a position to need those documents enforced.    You can complete Advanced Directives yourself, have them notarized, then have copies made for our office, for you and for anybody you feel should have them in safe keeping.  Or, you can have an attorney prepare these documents.   If you haven't updated your Last Will and Testament in a while, it may be worthwhile having an attorney prepare these documents together and save on some costs.       Separate significant issues discussed: Osteopenia, postmenopausal-counseled on weightbearing and aerobic exercise.  She is due to get a repeat bone density through gynecology next year.  Single kidney, history of  kidney donor-routine labs today   Jalinda was seen today for annual exam.  Diagnoses and all orders for this visit:  Encounter for health maintenance examination in adult -     Comprehensive metabolic panel -     CBC with Differential/Platelet -     Lipid panel -     POCT Urinalysis DIP (Proadvantage Device) -     VITAMIN D 25 Hydroxy (Vit-D Deficiency, Fractures)  Vaccine counseling  Single kidney -     Comprehensive metabolic panel  Screening for lung cancer  Screening for lipid disorders -     Lipid panel  Post-menopausal -     VITAMIN D 25 Hydroxy (Vit-D Deficiency, Fractures)  Osteopenia of multiple sites -     VITAMIN D 25 Hydroxy (Vit-D Deficiency, Fractures)  Kidney donor  History of kidney donation  Estrogen deficiency  Need for pneumococcal vaccination -     Pneumococcal conjugate vaccine 20-valent (Prevnar 20)    Follow-up pending labs, yearly for physical

## 2022-08-28 LAB — COMPREHENSIVE METABOLIC PANEL
ALT: 15 IU/L (ref 0–32)
AST: 22 IU/L (ref 0–40)
Albumin: 4.2 g/dL (ref 3.9–4.9)
Alkaline Phosphatase: 96 IU/L (ref 44–121)
BUN/Creatinine Ratio: 14 (ref 12–28)
BUN: 12 mg/dL (ref 8–27)
Bilirubin Total: 0.3 mg/dL (ref 0.0–1.2)
CO2: 26 mmol/L (ref 20–29)
Calcium: 9.6 mg/dL (ref 8.7–10.3)
Chloride: 104 mmol/L (ref 96–106)
Creatinine, Ser: 0.86 mg/dL (ref 0.57–1.00)
Globulin, Total: 2.8 g/dL (ref 1.5–4.5)
Glucose: 95 mg/dL (ref 70–99)
Potassium: 5.2 mmol/L (ref 3.5–5.2)
Sodium: 143 mmol/L (ref 134–144)
Total Protein: 7 g/dL (ref 6.0–8.5)
eGFR: 73 mL/min/{1.73_m2} (ref >59)

## 2022-08-28 LAB — CBC WITH DIFFERENTIAL/PLATELET
Basophils Absolute: 0.1 10*3/uL (ref 0.0–0.2)
Basos: 2 %
EOS (ABSOLUTE): 0.2 10*3/uL (ref 0.0–0.4)
Eos: 5 %
Hematocrit: 40.3 % (ref 34.0–46.6)
Hemoglobin: 12.6 g/dL (ref 11.1–15.9)
Immature Grans (Abs): 0 10*3/uL (ref 0.0–0.1)
Immature Granulocytes: 0 %
Lymphocytes Absolute: 1.2 10*3/uL (ref 0.7–3.1)
Lymphs: 32 %
MCH: 27.5 pg (ref 26.6–33.0)
MCHC: 31.3 g/dL — ABNORMAL LOW (ref 31.5–35.7)
MCV: 88 fL (ref 79–97)
Monocytes Absolute: 0.6 10*3/uL (ref 0.1–0.9)
Monocytes: 16 %
Neutrophils Absolute: 1.7 10*3/uL (ref 1.4–7.0)
Neutrophils: 45 %
Platelets: 229 10*3/uL (ref 150–450)
RBC: 4.58 x10E6/uL (ref 3.77–5.28)
RDW: 12.6 % (ref 11.7–15.4)
WBC: 3.7 10*3/uL (ref 3.4–10.8)

## 2022-08-28 LAB — LIPID PANEL
Chol/HDL Ratio: 2.8 ratio (ref 0.0–4.4)
Cholesterol, Total: 203 mg/dL — ABNORMAL HIGH (ref 100–199)
HDL: 72 mg/dL (ref >39)
LDL Chol Calc (NIH): 118 mg/dL — ABNORMAL HIGH (ref 0–99)
Triglycerides: 73 mg/dL (ref 0–149)
VLDL Cholesterol Cal: 13 mg/dL (ref 5–40)

## 2022-08-28 LAB — VITAMIN D 25 HYDROXY (VIT D DEFICIENCY, FRACTURES): Vit D, 25-Hydroxy: 78.6 ng/mL (ref 30.0–100.0)

## 2022-08-28 NOTE — Progress Notes (Signed)
Results sent through MyChart

## 2022-09-08 ENCOUNTER — Telehealth: Payer: Self-pay | Admitting: Medical

## 2022-09-08 ENCOUNTER — Other Ambulatory Visit: Payer: Self-pay | Admitting: Medical

## 2022-09-08 DIAGNOSIS — Z122 Encounter for screening for malignant neoplasm of respiratory organs: Secondary | ICD-10-CM

## 2022-09-08 DIAGNOSIS — R21 Rash and other nonspecific skin eruption: Secondary | ICD-10-CM

## 2022-09-08 DIAGNOSIS — Z87891 Personal history of nicotine dependence: Secondary | ICD-10-CM

## 2022-09-08 NOTE — Telephone Encounter (Signed)
Pt called and is requesting to have the Lung cancer screening  done pt can be reached at [phone]

## 2022-09-16 ENCOUNTER — Ambulatory Visit (HOSPITAL_COMMUNITY): Payer: BC Managed Care – PPO

## 2022-09-18 ENCOUNTER — Ambulatory Visit (HOSPITAL_COMMUNITY): Payer: BC Managed Care – PPO

## 2022-09-24 ENCOUNTER — Encounter: Payer: Self-pay | Admitting: Internal Medicine

## 2022-09-25 ENCOUNTER — Ambulatory Visit (HOSPITAL_COMMUNITY)
Admission: RE | Admit: 2022-09-25 | Discharge: 2022-09-25 | Disposition: A | Discharge status: Home / Self Care | Payer: BC Managed Care – PPO | Source: Ambulatory Visit | Attending: Medical | Admitting: Medical

## 2022-09-25 DIAGNOSIS — Z87891 Personal history of nicotine dependence: Secondary | ICD-10-CM | POA: Diagnosis present

## 2022-09-25 DIAGNOSIS — Z122 Encounter for screening for malignant neoplasm of respiratory organs: Secondary | ICD-10-CM | POA: Diagnosis present

## 2022-10-12 NOTE — Progress Notes (Signed)
Results sent through MyChart

## 2022-10-16 ENCOUNTER — Telehealth: Payer: Self-pay | Admitting: Medical

## 2022-10-16 NOTE — Telephone Encounter (Signed)
Grace Gates called and states her current inhaler has expired and she is asking if you can send her in a new one. She uses  [facility] [id number] - [place], [place] - [address]

## 2022-10-18 ENCOUNTER — Other Ambulatory Visit: Payer: Self-pay | Admitting: Medical

## 2022-10-18 MED ORDER — ALBUTEROL SULFATE HFA 108 (90 BASE) MCG/ACT IN AERS: 2.0000 | INHALATION_SPRAY | Freq: Four times a day (QID) | RESPIRATORY_TRACT | 2 refills | Status: DC | PRN

## 2023-03-24 ENCOUNTER — Ambulatory Visit (INDEPENDENT_AMBULATORY_CARE_PROVIDER_SITE_OTHER): Admitting: Medical

## 2023-03-24 VITALS — BP 110/64 | HR 96 | Temp 97.6°F | Wt 166.6 lb

## 2023-03-24 DIAGNOSIS — R0602 Shortness of breath: Secondary | ICD-10-CM

## 2023-03-24 DIAGNOSIS — R9389 Abnormal findings on diagnostic imaging of other specified body structures: Secondary | ICD-10-CM | POA: Diagnosis not present

## 2023-03-24 DIAGNOSIS — R0981 Nasal congestion: Secondary | ICD-10-CM | POA: Diagnosis not present

## 2023-03-24 DIAGNOSIS — Z87891 Personal history of nicotine dependence: Secondary | ICD-10-CM | POA: Diagnosis not present

## 2023-03-24 DIAGNOSIS — R067 Sneezing: Secondary | ICD-10-CM

## 2023-03-24 DIAGNOSIS — J342 Deviated nasal septum: Secondary | ICD-10-CM

## 2023-03-24 LAB — POCT INFLUENZA A/B
Influenza A, POC: NEGATIVE
Influenza B, POC: NEGATIVE

## 2023-03-24 LAB — POC COVID19 BINAXNOW: SARS Coronavirus 2 Ag: NEGATIVE

## 2023-03-24 MED ORDER — AIRSUPRA 90-80 MCG/ACT IN AERO: 2.0000 | INHALATION_SPRAY | Freq: Four times a day (QID) | RESPIRATORY_TRACT | 0 refills | Status: AC | PRN

## 2023-03-24 MED ORDER — FLUTICASONE-SALMETEROL 115-21 MCG/ACT IN AERO: 2.0000 | INHALATION_SPRAY | Freq: Two times a day (BID) | RESPIRATORY_TRACT | 2 refills | Status: DC

## 2023-03-24 NOTE — Progress Notes (Signed)
 Subjective:  Grace Gates is a 73 y.o. female who presents for Chief Complaint  Patient presents with   Allergies    Allergies since Friday. Thought was a cold starting but then now thinks it has turns into seasonal allergies. Nothing OTC is working     Here for possible allergies vs cold.  Mild sore throat, dry cough, but seems like her seasonal allergies.   Cough some , then sneezing some, then runny nose.  Has had a little SOB.   Eyes feel heavy.   Has some ear popping.   No fever, no boyd aches or chills.  Has had some headaches.    Husband had a cold last week,  he was getting over it, but she has been around him.  Not sure if she this is a cold or allergies.  She had bad episode of allergies last fall including quite a bit of SOB,  having to use inhalers.   She had not had prior several flare up of allergies until last year.   Been using some Flonase, some loratadine.    Quit smoking 15 years ago.    No other aggravating or relieving factors.    No other c/o.  Past Medical History:  Diagnosis Date   Allergy    seasonal   Colon polyps    Dizziness 2015   eval with carotid dopplers, echo, brain MRI.   Kidney donor    Postmenopausal HRT (hormone replacement therapy)    [facility]   Wears glasses    wears bifocals   Current Outpatient Medications on File Prior to Visit  Medication Sig Dispense Refill   albuterol (VENTOLIN HFA) 108 (90 Base) MCG/ACT inhaler Inhale 2 puffs into the lungs every 6 (six) hours as needed for wheezing or shortness of breath. 8 g 2   fexofenadine (ALLEGRA) 30 MG tablet Take 30 mg by mouth 2 (two) times daily. (Patient not taking: Reported on 03/24/2023)     No current facility-administered medications on file prior to visit.     The following portions of the patient's history were reviewed and updated as appropriate: allergies, current medications, past family history, past medical history, past social history, past surgical history and  problem list.  ROS Otherwise as in subjective above    Objective: BP 110/64   Pulse 96   Temp 97.6 F (36.4 C)   Wt 166 lb 9.6 oz (75.6 kg)   BMI 27.30 kg/m   General appearance: alert, no distress, well developed, well nourished HEENT: normocephalic, sclerae anicteric, conjunctiva with mild injection bilaterally, moist, TMs pearly, nares with deviated septum, deviated to the left, but mostly patent on the right, no discharge or erythema, pharynx normal Oral cavity: MMM, no lesions Neck: supple, no lymphadenopathy, no thyromegaly, no masses Heart: RRR, normal S1, S2, no murmurs Lungs: CTA bilaterally, no wheezes, rhonchi, or rales Pulses: 2+ radial pulses, 2+ pedal pulses, normal cap refill Ext: no edema   Assessment: Encounter Diagnoses  Name Primary?   SOB (shortness of breath) Yes   Sneezing    Nasal congestion    Former smoker    Abnormal chest CT    Deviated septum      Plan: We discussed symptoms and concerns.  Given her exposure to her husband last week and a cold we did a COVID test.  COVID testing.  PFT shows moderate airway obstruction.   Symptoms suggest allergic asthma.  She does have a long history of smoking, 40-pack-year history but she  quit 15 years ago.  She had a CT 09/2022 that showed some emphysema changes.  We discussed allergy medication.  You can use over-the-counter allergy tablet such as Zyrtec, Allegra, or Xyzal tablet at nighttime for allergy symptoms.  You can use nasal spray such as Flonase or Nasacort over-the-counter for nasal congestion, runny nose and allergy symptoms.  If needed, you can use over-the-counter allergy eyedrops such as Pataday that worked really well for itchy watery eyes  Begin trial of Advair maintenance inhaler 2 puffs twice daily to help reduce asthma and allergy lung symptoms.  For acute symptoms such as acute shortness of breath, wheezing, coughing fits, you can use albuterol rescue inhaler 2 puffs every 4-6  hours as needed.    Rinse your mouth out with water after use of any inhaler.   Afsa was seen today for allergies.  Diagnoses and all orders for this visit:  SOB (shortness of breath)  Sneezing  Nasal congestion  Former smoker  Abnormal chest CT  Deviated septum    Follow up: recheck 23mo

## 2023-03-24 NOTE — Patient Instructions (Signed)
 Symptoms suggest allergic asthma.  She does have a long history of smoking, 40-pack-year history but she quit 15 years ago.  She had a CT 09/2022 that showed some emphysema changes.  We discussed allergy medication.  You can use over-the-counter allergy tablet such as Zyrtec, Allegra, or Xyzal tablet at nighttime for allergy symptoms.  You can use nasal spray such as Flonase or Nasacort over-the-counter for nasal congestion, runny nose and allergy symptoms.  If needed, you can use over-the-counter allergy eyedrops such as Pataday that worked really well for itchy watery eyes  Begin trial of Advair maintenance inhaler 2 puffs twice daily to help reduce asthma and allergy lung symptoms.  For acute symptoms such as acute shortness of breath, wheezing, coughing fits, you can use albuterol rescue inhaler 2 puffs every 4-6 hours as needed.    Rinse your mouth out with water after use of any inhaler.

## 2023-03-25 ENCOUNTER — Telehealth: Payer: Self-pay | Admitting: Internal Medicine

## 2023-03-25 MED ORDER — ADVAIR HFA 115-21 MCG/ACT IN AERO: 2.0000 | INHALATION_SPRAY | Freq: Two times a day (BID) | RESPIRATORY_TRACT | 2 refills | Status: DC

## 2023-03-25 NOTE — Telephone Encounter (Signed)
 Patient came by the office. She need Advair HFA as it will only cost her a copay versus $100 for Fluticasone generic brand.   Advised her that Paulene Floor was a 2 in one inhaler and that she can't use the albuterol with this medication as albuterol is already in the [place]

## 2023-07-13 DIAGNOSIS — H18413 Arcus senilis, bilateral: Secondary | ICD-10-CM | POA: Diagnosis not present

## 2023-07-13 DIAGNOSIS — H25043 Posterior subcapsular polar age-related cataract, bilateral: Secondary | ICD-10-CM | POA: Diagnosis not present

## 2023-07-13 DIAGNOSIS — H2512 Age-related nuclear cataract, left eye: Secondary | ICD-10-CM | POA: Diagnosis not present

## 2023-07-13 DIAGNOSIS — H25013 Cortical age-related cataract, bilateral: Secondary | ICD-10-CM | POA: Diagnosis not present

## 2023-07-13 DIAGNOSIS — H2513 Age-related nuclear cataract, bilateral: Secondary | ICD-10-CM | POA: Diagnosis not present

## 2023-08-04 DIAGNOSIS — Z01411 Encounter for gynecological examination (general) (routine) with abnormal findings: Secondary | ICD-10-CM | POA: Diagnosis not present

## 2023-08-04 DIAGNOSIS — Z124 Encounter for screening for malignant neoplasm of cervix: Secondary | ICD-10-CM | POA: Diagnosis not present

## 2023-08-04 DIAGNOSIS — Z01419 Encounter for gynecological examination (general) (routine) without abnormal findings: Secondary | ICD-10-CM | POA: Diagnosis not present

## 2023-08-04 DIAGNOSIS — Z1231 Encounter for screening mammogram for malignant neoplasm of breast: Secondary | ICD-10-CM | POA: Diagnosis not present

## 2023-08-04 DIAGNOSIS — Z1331 Encounter for screening for depression: Secondary | ICD-10-CM | POA: Diagnosis not present

## 2023-08-04 DIAGNOSIS — Z779 Other contact with and (suspected) exposures hazardous to health: Secondary | ICD-10-CM | POA: Diagnosis not present

## 2023-08-04 LAB — HM MAMMOGRAPHY

## 2023-08-09 ENCOUNTER — Other Ambulatory Visit: Payer: Self-pay | Admitting: Obstetrics & Gynecology

## 2023-08-09 DIAGNOSIS — Z803 Family history of malignant neoplasm of breast: Secondary | ICD-10-CM

## 2023-09-02 ENCOUNTER — Encounter: Payer: Self-pay | Admitting: Medical

## 2023-09-02 ENCOUNTER — Ambulatory Visit (INDEPENDENT_AMBULATORY_CARE_PROVIDER_SITE_OTHER): Payer: BC Managed Care – PPO | Admitting: Medical

## 2023-09-02 VITALS — BP 120/70 | HR 73 | Ht 65.0 in | Wt 169.0 lb

## 2023-09-02 DIAGNOSIS — M8589 Other specified disorders of bone density and structure, multiple sites: Secondary | ICD-10-CM

## 2023-09-02 DIAGNOSIS — Z780 Asymptomatic menopausal state: Secondary | ICD-10-CM

## 2023-09-02 DIAGNOSIS — J4532 Mild persistent asthma with status asthmaticus: Secondary | ICD-10-CM | POA: Diagnosis not present

## 2023-09-02 DIAGNOSIS — Z905 Acquired absence of kidney: Secondary | ICD-10-CM | POA: Diagnosis not present

## 2023-09-02 DIAGNOSIS — Z87891 Personal history of nicotine dependence: Secondary | ICD-10-CM | POA: Diagnosis not present

## 2023-09-02 DIAGNOSIS — Z0000 Encounter for general adult medical examination without abnormal findings: Secondary | ICD-10-CM | POA: Diagnosis not present

## 2023-09-02 DIAGNOSIS — Z13220 Encounter for screening for lipoid disorders: Secondary | ICD-10-CM

## 2023-09-02 DIAGNOSIS — Z7185 Encounter for immunization safety counseling: Secondary | ICD-10-CM

## 2023-09-02 NOTE — Progress Notes (Signed)
 Subjective:   HPI  Grace Gates is a 73 y.o. female who presents for Chief Complaint  Patient presents with   Annual Exam    Cpe.     Patient Care Team: Daundre Biel, Alm GORMAN RIGGERS as PCP - General Sees dentist Sees eye doctor Dr. Elsie Cree, GI Gyn, Dr. Robbi Render  Concerns: Here for physical  Donated blood 2 weeks ago, BP was 120/70.  Doing fine, no issues.  Exercises about 3 days per week, treadmill, weight bearing exercise.    Retired 01/2023.    Reviewed their medical, surgical, family, social, medication, and allergy history and updated chart as appropriate.  Past Medical History:  Diagnosis Date   Allergy    seasonal   Colon polyps    Dizziness 2015   eval with carotid dopplers, echo, brain MRI.   Kidney donor    Postmenopausal HRT (hormone replacement therapy)    [facility]   Wears glasses    wears bifocals    Family History  Problem Relation Age of Onset   Alcohol abuse Mother    Cirrhosis Mother        died of cirrhosis   Cancer Father        died of rare bone cancer   Cancer Sister        endometrial cancer   Cancer Sister        breast cancer, ovarian cancer   Heart disease Paternal Uncle        died of MI   Stroke Neg Hx    Hypertension Neg Hx    Diabetes Neg Hx      Current Outpatient Medications:    ADVAIR  HFA 115-21 MCG/ACT inhaler, Inhale 2 puffs into the lungs 2 (two) times daily., Disp: 1 each, Rfl: 2   albuterol  (VENTOLIN  HFA) 108 (90 Base) MCG/ACT inhaler, Inhale 2 puffs into the lungs every 6 (six) hours as needed for wheezing or shortness of breath., Disp: 8 g, Rfl: 2   Albuterol -Budesonide (AIRSUPRA ) 90-80 MCG/ACT AERO, Inhale 2 puffs into the lungs every 6 (six) hours as needed., Disp: 10.7 g, Rfl: 0   fexofenadine (ALLEGRA) 30 MG tablet, Take 30 mg by mouth 2 (two) times daily., Disp: , Rfl:   Allergies  Allergen Reactions   Adhesive [Tape] Rash   Review of Systems  Constitutional:  Negative for chills, fever,  malaise/fatigue and weight loss.  HENT:  Negative for congestion, ear pain, hearing loss, sore throat and tinnitus.   Eyes:  Negative for blurred vision, pain and redness.  Respiratory:  Negative for cough, hemoptysis and shortness of breath.   Cardiovascular:  Negative for chest pain, palpitations, orthopnea, claudication and leg swelling.  Gastrointestinal:  Negative for abdominal pain, blood in stool, constipation, diarrhea, nausea and vomiting.  Genitourinary:  Negative for dysuria, flank pain, frequency, hematuria and urgency.  Musculoskeletal:  Negative for falls, joint pain and myalgias.  Skin:  Negative for itching and rash.  Neurological:  Negative for dizziness, tingling, speech change, weakness and headaches.  Endo/Heme/Allergies:  Negative for polydipsia. Does not bruise/bleed easily.  Psychiatric/Behavioral:  Negative for depression and memory loss. The patient is not nervous/anxious and does not have insomnia.         09/02/2023    8:07 AM 08/27/2022    8:23 AM 08/22/2021    8:22 AM 04/23/2021   11:39 AM 08/20/2020   10:06 AM  Depression screen PHQ 2/9  Decreased Interest 0 0 0 0 0  Down, Depressed, Hopeless  0 0 0 0 0  PHQ - 2 Score 0 0 0 0 0       Objective:  BP 120/70   Pulse 73   Ht 5' 5 (1.651 m)   Wt 169 lb (76.7 kg)   SpO2 98%   BMI 28.12 kg/m   BP Readings from Last 3 Encounters:  09/02/23 120/70  03/24/23 110/64  08/27/22 120/72   Wt Readings from Last 3 Encounters:  09/02/23 169 lb (76.7 kg)  03/24/23 166 lb 9.6 oz (75.6 kg)  08/27/22 166 lb 12.8 oz (75.7 kg)    General appearance: alert, no distress, WD/WN, Caucasian female Skin: unremarkable HEENT: normocephalic, conjunctiva/corneas normal, sclerae anicteric, PERRLA, EOMi, no discharge or erythema, pharynx normal Oral cavity: MMM, tongue normal, teeth normal Neck: supple, no lymphadenopathy, no thyromegaly, no masses, normal ROM, no bruits Chest: non tender, normal shape and expansion Heart:  RRR, normal S1, S2, no murmurs Lungs: CTA bilaterally, no wheezes, rhonchi, or rales Abdomen: +bs, soft, multiple surgical scars, non tender, non distended, no masses, no hepatomegaly, no splenomegaly, no bruits Back: non tender, normal ROM, no scoliosis Musculoskeletal: upper extremities non tender, no obvious deformity, normal ROM throughout, lower extremities non tender, no obvious deformity, normal ROM throughout Extremities: no edema, no cyanosis, no clubbing Pulses: 2+ symmetric, upper and lower extremities, normal cap refill Neurological: alert, oriented x 3, CN2-12 intact, strength normal upper extremities and lower extremities, sensation normal throughout, DTRs 2+ throughout, no cerebellar signs, gait normal Psychiatric: normal affect, behavior normal, pleasant  Breast/gyn/rectal - deferred to gynecology     Assessment and Plan :   Encounter Diagnoses  Name Primary?   Encounter for health maintenance examination in adult Yes   Vaccine counseling    Single kidney    Screening for lipid disorders    Post-menopausal    Osteopenia of multiple sites    Former smoker    Asthma with allergic rhinitis and status asthmaticus, mild persistent       This visit was a preventative care visit, also known as wellness visit or routine physical.   Topics typically include healthy lifestyle, diet, exercise, preventative care, vaccinations, sick and well care, proper use of emergency dept and after hours care, as well as other concerns.     Recommendations: Continue to return yearly for your annual wellness and preventative care visits.  This gives us  a chance to discuss healthy lifestyle, exercise, vaccinations, review your chart record, and perform screenings where appropriate.  I recommend you see your eye doctor yearly for routine vision care.  I recommend you see your dentist yearly for routine dental care including hygiene visits twice yearly.  See your gynecologist yearly for  routine gynecological care.    Vaccination recommendations were reviewed Immunization History  Administered Date(s) Administered   Fluad Quad(high Dose 65+) 08/26/2018   INFLUENZA, HIGH DOSE SEASONAL PF 10/07/2017   Influenza-Unspecified 10/05/2021   Moderna Sars-Covid-2 Vaccination 02/10/2019, 03/11/2019, 10/18/2019   PNEUMOCOCCAL CONJUGATE-20 08/27/2022   Pfizer(Comirnaty)Fall Seasonal Vaccine 12 years and older 10/05/2021   Pneumococcal Conjugate-13 02/17/2018   Tdap 08/20/2020   Zoster Recombinant(Shingrix ) 02/24/2018, 04/29/2018    Advised yearly flu vaccine   Screening for cancer: Colon cancer screening: I reviewed your colonoscopy on file that is up to date from 2023  Breast cancer screening: You should perform a self breast exam monthly.   We reviewed recommendations for regular mammograms and breast cancer screening.  Cervical cancer screening: We reviewed recommendations for pap smear screening.  Skin cancer screening: Check your skin regularly for new changes, growing lesions, or other lesions of concern Come in for evaluation if you have skin lesions of concern.  Lung cancer screening: If you have a greater than 20 pack year history of tobacco use, then you may qualify for lung cancer screening with a chest CT scan.   Please call your insurance company to inquire about coverage for this test.  We currently don't have screenings for other cancers besides breast, cervical, colon, and lung cancers.  If you have a strong family history of cancer or have other cancer screening concerns, please let me know.    Bone health: Get at least 150 minutes of aerobic exercise weekly Get weight bearing exercise at least once weekly Bone density test:  A bone density test is an imaging test that uses a type of X-ray to measure the amount of calcium and other minerals in your bones. The test may be used to diagnose or screen you for a condition that causes weak or thin bones  (osteoporosis), predict your risk for a broken bone (fracture), or determine how well your osteoporosis treatment is working. The bone density test is recommended for females 65 and older, or females or males <65 if certain risk factors such as thyroid disease, long term use of steroids such as for asthma or rheumatological issues, vitamin D  deficiency, estrogen deficiency, family history of osteoporosis, self or family history of fragility fracture in first degree relative.  Reviewed 2022 bone density test showing osteopenia.  Due for updated bone density test.  Is doing through [place].    Heart health: Get at least 150 minutes of aerobic exercise weekly Limit alcohol It is important to maintain a healthy blood pressure and healthy cholesterol numbers  Heart disease screening: Screening for heart disease includes screening for blood pressure, fasting lipids, glucose/diabetes screening, BMI height to weight ratio, reviewed of smoking status, physical activity, and diet.    Goals include blood pressure 120/80 or less, maintaining a healthy lipid/cholesterol profile, preventing diabetes or keeping diabetes numbers under good control, not smoking or using tobacco products, exercising most days per week or at least 150 minutes per week of exercise, and eating healthy variety of fruits and vegetables, healthy oils, and avoiding unhealthy food choices like fried food, fast food, high sugar and high cholesterol foods.    Other tests may possibly include EKG test, CT coronary calcium score, echocardiogram, exercise treadmill stress test.   CT coronary calcium test 09/06/20 score of zero.   Medical care options: I recommend you continue to seek care here first for routine care.  We try really hard to have available appointments Monday through Friday daytime hours for sick visits, acute visits, and physicals.  Urgent care should be used for after hours and weekends for significant issues that cannot wait  till the next day.  The emergency department should be used for significant potentially life-threatening emergencies.  The emergency department is expensive, can often have long wait times for less significant concerns, so try to utilize primary care, urgent care, or telemedicine when possible to avoid unnecessary trips to the emergency department.  Virtual visits and telemedicine have been introduced since the pandemic started in 2020, and can be convenient ways to receive medical care.  We offer virtual appointments as well to assist you in a variety of options to seek medical care.   Advanced Directives: I recommend you consider completing a Health Care Power of Attorney and Living Will.  These documents respect your wishes and help alleviate burdens on your loved ones if you were to become terminally ill or be in a position to need those documents enforced.    You can complete Advanced Directives yourself, have them notarized, then have copies made for our office, for you and for anybody you feel should have them in safe keeping.  Or, you can have an attorney prepare these documents.   If you haven't updated your Last Will and Testament in a while, it may be worthwhile having an attorney prepare these documents together and save on some costs.       Separate significant issues discussed: Osteopenia, postmenopausal-counseled on weightbearing and aerobic exercise.    Single kidney, history of kidney donor-routine labs today  Asthma and allergies-mainly needs her medications during spring and fall.  Does not need refills today but will call back later for refills.  Left ear discomfort-advised short-term Mucinex or low-dose decongestant over-the-counter for the next 5 days.  Continue good water intake.  No obvious infection.   Evolette was seen today for annual exam.  Diagnoses and all orders for this visit:  Encounter for health maintenance examination in adult -     Renal Function Panel -      Hepatic Function Panel -     CBC -     Lipid panel -     Microalbumin/Creatinine Ratio, Urine -     Urinalysis, Routine w reflex microscopic  Vaccine counseling  Single kidney -     Renal Function Panel -     Microalbumin/Creatinine Ratio, Urine -     Urinalysis, Routine w reflex microscopic  Screening for lipid disorders -     Lipid panel  Post-menopausal  Osteopenia of multiple sites  Former smoker  Asthma with allergic rhinitis and status asthmaticus, mild persistent     Follow-up pending labs, yearly for physical

## 2023-09-03 ENCOUNTER — Other Ambulatory Visit: Payer: Self-pay | Admitting: Obstetrics & Gynecology

## 2023-09-03 DIAGNOSIS — Z1231 Encounter for screening mammogram for malignant neoplasm of breast: Secondary | ICD-10-CM

## 2023-09-03 LAB — CBC
Hematocrit: 38 % (ref 34.0–46.6)
Hemoglobin: 12.1 g/dL (ref 11.1–15.9)
MCH: 30 pg (ref 26.6–33.0)
MCHC: 31.8 g/dL (ref 31.5–35.7)
MCV: 94 fL (ref 79–97)
Platelets: 216 x10E3/uL (ref 150–450)
RBC: 4.03 x10E6/uL (ref 3.77–5.28)
RDW: 14.1 % (ref 11.7–15.4)
WBC: 4 x10E3/uL (ref 3.4–10.8)

## 2023-09-03 LAB — MICROALBUMIN / CREATININE URINE RATIO
Creatinine, Urine: 62.2 mg/dL
Microalb/Creat Ratio: 9 mg/g{creat} (ref 0–29)
Microalbumin, Urine: 5.4 ug/mL

## 2023-09-03 LAB — RENAL FUNCTION PANEL
Albumin: 4 g/dL (ref 3.9–4.9)
BUN/Creatinine Ratio: 14 (ref 12–28)
BUN: 13 mg/dL (ref 8–27)
CO2: 23 mmol/L (ref 20–29)
Calcium: 9.5 mg/dL (ref 8.7–10.3)
Chloride: 104 mmol/L (ref 96–106)
Creatinine, Ser: 0.92 mg/dL (ref 0.57–1.00)
Glucose: 91 mg/dL (ref 70–99)
Phosphorus: 4.3 mg/dL (ref 3.0–4.3)
Potassium: 4.4 mmol/L (ref 3.5–5.2)
Sodium: 141 mmol/L (ref 134–144)
eGFR: 67 mL/min/1.73 (ref >59)

## 2023-09-03 LAB — URINALYSIS, ROUTINE W REFLEX MICROSCOPIC
Bilirubin, UA: NEGATIVE
Glucose, UA: NEGATIVE
Ketones, UA: NEGATIVE
Leukocytes,UA: NEGATIVE
Nitrite, UA: NEGATIVE
Protein,UA: NEGATIVE
RBC, UA: NEGATIVE
Specific Gravity, UA: 1.013 (ref 1.005–1.030)
Urobilinogen, Ur: 0.2 mg/dL (ref 0.2–1.0)
pH, UA: 7 (ref 5.0–7.5)

## 2023-09-03 LAB — LIPID PANEL
Chol/HDL Ratio: 2.6 ratio (ref 0.0–4.4)
Cholesterol, Total: 201 mg/dL — ABNORMAL HIGH (ref 100–199)
HDL: 77 mg/dL (ref >39)
LDL Chol Calc (NIH): 111 mg/dL — ABNORMAL HIGH (ref 0–99)
Triglycerides: 74 mg/dL (ref 0–149)
VLDL Cholesterol Cal: 13 mg/dL (ref 5–40)

## 2023-09-03 LAB — HEPATIC FUNCTION PANEL
ALT: 16 IU/L (ref 0–32)
AST: 21 IU/L (ref 0–40)
Alkaline Phosphatase: 85 IU/L (ref 44–121)
Bilirubin Total: 0.4 mg/dL (ref 0.0–1.2)
Bilirubin, Direct: 0.14 mg/dL (ref 0.00–0.40)
Total Protein: 6.5 g/dL (ref 6.0–8.5)

## 2023-09-05 ENCOUNTER — Ambulatory Visit: Payer: Self-pay | Admitting: Medical

## 2023-09-05 NOTE — Progress Notes (Signed)
 Results thru my chart

## 2023-09-07 ENCOUNTER — Other Ambulatory Visit: Payer: Self-pay | Admitting: Obstetrics & Gynecology

## 2023-09-07 DIAGNOSIS — Z803 Family history of malignant neoplasm of breast: Secondary | ICD-10-CM

## 2023-09-13 ENCOUNTER — Telehealth: Payer: Self-pay | Admitting: Family Medicine

## 2023-09-13 MED ORDER — COVID-19 MRNA VAC-TRIS(PFIZER) 30 MCG/0.3ML IM SUSY: 0.3000 mL | PREFILLED_SYRINGE | Freq: Once | INTRAMUSCULAR | 0 refills | Status: AC

## 2023-09-13 NOTE — Telephone Encounter (Signed)
 Pt wants covid vaccine from [facility] on [address] for her and her husband.Please eprescribe.

## 2023-09-13 NOTE — Telephone Encounter (Signed)
 Send order over to [facility]

## 2023-09-13 NOTE — Telephone Encounter (Signed)
 Called Grace Gates spoke with husband, [email or web address].  Pt needs welcome to Medicare visit by 02/06/24.

## 2023-09-15 DIAGNOSIS — H2512 Age-related nuclear cataract, left eye: Secondary | ICD-10-CM | POA: Diagnosis not present

## 2023-09-16 DIAGNOSIS — H2511 Age-related nuclear cataract, right eye: Secondary | ICD-10-CM | POA: Diagnosis not present

## 2023-09-29 DIAGNOSIS — H5211 Myopia, right eye: Secondary | ICD-10-CM | POA: Diagnosis not present

## 2023-09-29 DIAGNOSIS — H2511 Age-related nuclear cataract, right eye: Secondary | ICD-10-CM | POA: Diagnosis not present

## 2023-10-07 DIAGNOSIS — M8589 Other specified disorders of bone density and structure, multiple sites: Secondary | ICD-10-CM | POA: Diagnosis not present

## 2023-10-07 LAB — HM DEXA SCAN

## 2023-10-09 ENCOUNTER — Ambulatory Visit: Payer: Self-pay | Admitting: Medical

## 2023-10-09 NOTE — Progress Notes (Signed)
 Results through MyChart

## 2023-12-21 ENCOUNTER — Ambulatory Visit: Payer: Self-pay

## 2024-01-04 ENCOUNTER — Ambulatory Visit: Admitting: Medical

## 2024-01-04 ENCOUNTER — Telehealth: Payer: Self-pay | Admitting: Internal Medicine

## 2024-01-04 MED ORDER — ADVAIR HFA 115-21 MCG/ACT IN AERO: 2.0000 | INHALATION_SPRAY | Freq: Two times a day (BID) | RESPIRATORY_TRACT | 2 refills | Status: AC

## 2024-01-04 MED ORDER — ALBUTEROL SULFATE HFA 108 (90 BASE) MCG/ACT IN AERS: 2.0000 | INHALATION_SPRAY | Freq: Four times a day (QID) | RESPIRATORY_TRACT | 2 refills | Status: AC | PRN

## 2024-01-04 NOTE — Telephone Encounter (Signed)
 Pt needed a refill on both inhalers

## 2024-02-08 ENCOUNTER — Other Ambulatory Visit

## 2024-03-05 ENCOUNTER — Other Ambulatory Visit

## 2024-09-07 ENCOUNTER — Encounter: Payer: Self-pay | Admitting: Medical
